# Patient Record
Sex: Male | Born: 1945 | Race: Black or African American | Hispanic: No | Smoking: Current every day smoker
Health system: Southern US, Community
[De-identification: ages and names within clinical notes are randomized; demographics above are authoritative.]

## PROBLEM LIST (undated history)

## (undated) DIAGNOSIS — D649 Anemia, unspecified: Secondary | ICD-10-CM

## (undated) DIAGNOSIS — I10 Essential (primary) hypertension: Secondary | ICD-10-CM

## (undated) DIAGNOSIS — E079 Disorder of thyroid, unspecified: Secondary | ICD-10-CM

## (undated) HISTORY — PX: OTHER SURGICAL HISTORY: SHX169

## (undated) HISTORY — DX: Disorder of thyroid, unspecified: E07.9

## (undated) HISTORY — DX: Anemia, unspecified: D64.9

---

## 2006-03-17 ENCOUNTER — Emergency Department: Payer: Self-pay

## 2008-03-07 ENCOUNTER — Emergency Department: Payer: Self-pay | Admitting: Emergency Medicine

## 2011-02-06 ENCOUNTER — Emergency Department: Payer: Self-pay | Admitting: Emergency Medicine

## 2017-11-20 ENCOUNTER — Telehealth: Payer: Self-pay | Admitting: *Deleted

## 2017-11-20 NOTE — Telephone Encounter (Signed)
Received referral for low dose lung cancer screening CT scan.  Unable to leave message at this time, will attempt call at later point.

## 2017-11-24 ENCOUNTER — Telehealth: Payer: Self-pay | Admitting: *Deleted

## 2017-11-24 NOTE — Telephone Encounter (Signed)
Received referral for low dose lung cancer screening CT scan.  Unable to leave message at this time, will attempt call at later point.

## 2017-11-25 ENCOUNTER — Telehealth: Payer: Self-pay | Admitting: *Deleted

## 2017-11-25 ENCOUNTER — Encounter: Payer: Self-pay | Admitting: *Deleted

## 2017-11-25 NOTE — Telephone Encounter (Signed)
Received referral for low dose lung cancer screening CT scan.  Unable to leave message at this time, will attempt call at later point.   Letter sent

## 2017-12-14 ENCOUNTER — Emergency Department: Payer: Medicare Other

## 2017-12-14 ENCOUNTER — Other Ambulatory Visit: Payer: Self-pay

## 2017-12-14 ENCOUNTER — Inpatient Hospital Stay
Admission: EM | Admit: 2017-12-14 | Discharge: 2017-12-16 | DRG: 435 | Disposition: A | Discharge status: Home / Self Care | Payer: Medicare Other | Attending: Specialist | Admitting: Specialist

## 2017-12-14 ENCOUNTER — Encounter: Payer: Self-pay | Admitting: Emergency Medicine

## 2017-12-14 DIAGNOSIS — I959 Hypotension, unspecified: Secondary | ICD-10-CM | POA: Diagnosis present

## 2017-12-14 DIAGNOSIS — C3490 Malignant neoplasm of unspecified part of unspecified bronchus or lung: Secondary | ICD-10-CM | POA: Diagnosis present

## 2017-12-14 DIAGNOSIS — Z79899 Other long term (current) drug therapy: Secondary | ICD-10-CM

## 2017-12-14 DIAGNOSIS — C787 Secondary malignant neoplasm of liver and intrahepatic bile duct: Secondary | ICD-10-CM | POA: Diagnosis present

## 2017-12-14 DIAGNOSIS — I10 Essential (primary) hypertension: Secondary | ICD-10-CM | POA: Diagnosis present

## 2017-12-14 DIAGNOSIS — D473 Essential (hemorrhagic) thrombocythemia: Secondary | ICD-10-CM

## 2017-12-14 DIAGNOSIS — Z716 Tobacco abuse counseling: Secondary | ICD-10-CM

## 2017-12-14 DIAGNOSIS — Z681 Body mass index (BMI) 19 or less, adult: Secondary | ICD-10-CM

## 2017-12-14 DIAGNOSIS — Z79890 Hormone replacement therapy (postmenopausal): Secondary | ICD-10-CM

## 2017-12-14 DIAGNOSIS — J449 Chronic obstructive pulmonary disease, unspecified: Secondary | ICD-10-CM | POA: Diagnosis present

## 2017-12-14 DIAGNOSIS — R000 Tachycardia, unspecified: Secondary | ICD-10-CM | POA: Diagnosis present

## 2017-12-14 DIAGNOSIS — E876 Hypokalemia: Secondary | ICD-10-CM | POA: Diagnosis present

## 2017-12-14 DIAGNOSIS — R160 Hepatomegaly, not elsewhere classified: Secondary | ICD-10-CM | POA: Diagnosis not present

## 2017-12-14 DIAGNOSIS — E43 Unspecified severe protein-calorie malnutrition: Secondary | ICD-10-CM | POA: Diagnosis present

## 2017-12-14 DIAGNOSIS — E860 Dehydration: Secondary | ICD-10-CM | POA: Diagnosis present

## 2017-12-14 DIAGNOSIS — F17210 Nicotine dependence, cigarettes, uncomplicated: Secondary | ICD-10-CM | POA: Diagnosis present

## 2017-12-14 DIAGNOSIS — K769 Liver disease, unspecified: Secondary | ICD-10-CM

## 2017-12-14 DIAGNOSIS — E039 Hypothyroidism, unspecified: Secondary | ICD-10-CM | POA: Diagnosis present

## 2017-12-14 DIAGNOSIS — R188 Other ascites: Secondary | ICD-10-CM | POA: Diagnosis present

## 2017-12-14 DIAGNOSIS — D649 Anemia, unspecified: Secondary | ICD-10-CM | POA: Diagnosis not present

## 2017-12-14 DIAGNOSIS — D509 Iron deficiency anemia, unspecified: Secondary | ICD-10-CM | POA: Diagnosis present

## 2017-12-14 DIAGNOSIS — R918 Other nonspecific abnormal finding of lung field: Secondary | ICD-10-CM | POA: Diagnosis not present

## 2017-12-14 DIAGNOSIS — A419 Sepsis, unspecified organism: Secondary | ICD-10-CM | POA: Diagnosis present

## 2017-12-14 DIAGNOSIS — D630 Anemia in neoplastic disease: Secondary | ICD-10-CM | POA: Diagnosis present

## 2017-12-14 HISTORY — DX: Essential (primary) hypertension: I10

## 2017-12-14 LAB — CBC
HCT: 29.2 % — ABNORMAL LOW (ref 40.0–52.0)
HEMOGLOBIN: 9.1 g/dL — AB (ref 13.0–18.0)
MCH: 24.2 pg — AB (ref 26.0–34.0)
MCHC: 31.2 g/dL — ABNORMAL LOW (ref 32.0–36.0)
MCV: 77.6 fL — ABNORMAL LOW (ref 80.0–100.0)
PLATELETS: 574 10*3/uL — AB (ref 150–440)
RBC: 3.77 MIL/uL — AB (ref 4.40–5.90)
RDW: 15.8 % — ABNORMAL HIGH (ref 11.5–14.5)
WBC: 18.2 10*3/uL — ABNORMAL HIGH (ref 3.8–10.6)

## 2017-12-14 LAB — BASIC METABOLIC PANEL
ANION GAP: 16 — AB (ref 5–15)
BUN: 10 mg/dL (ref 8–23)
CALCIUM: 8.9 mg/dL (ref 8.9–10.3)
CO2: 24 mmol/L (ref 22–32)
Chloride: 97 mmol/L — ABNORMAL LOW (ref 98–111)
Creatinine, Ser: 0.7 mg/dL (ref 0.61–1.24)
Glucose, Bld: 137 mg/dL — ABNORMAL HIGH (ref 70–99)
Potassium: 3 mmol/L — ABNORMAL LOW (ref 3.5–5.1)
SODIUM: 137 mmol/L (ref 135–145)

## 2017-12-14 LAB — TROPONIN I

## 2017-12-14 LAB — LACTIC ACID, PLASMA
LACTIC ACID, VENOUS: 3.7 mmol/L — AB (ref 0.5–1.9)
Lactic Acid, Venous: 2.9 mmol/L (ref 0.5–1.9)

## 2017-12-14 MED ORDER — SODIUM CHLORIDE 0.9 % IV SOLN
INTRAVENOUS | Status: DC
  Administered 2017-12-14 – 2017-12-16 (×5): via INTRAVENOUS

## 2017-12-14 MED ORDER — ACETAMINOPHEN 650 MG RE SUPP: 650.0000 mg | Freq: Four times a day (QID) | RECTAL | Status: DC | PRN

## 2017-12-14 MED ORDER — IOPAMIDOL (ISOVUE-300) INJECTION 61%
100.0000 mL | Freq: Once | INTRAVENOUS | Status: AC | PRN
  Administered 2017-12-14: 100 mL via INTRAVENOUS

## 2017-12-14 MED ORDER — ONDANSETRON HCL 4 MG PO TABS: 4.0000 mg | ORAL_TABLET | Freq: Four times a day (QID) | ORAL | Status: DC | PRN

## 2017-12-14 MED ORDER — ONDANSETRON HCL 4 MG/2ML IJ SOLN: 4.0000 mg | Freq: Four times a day (QID) | INTRAMUSCULAR | Status: DC | PRN

## 2017-12-14 MED ORDER — SODIUM CHLORIDE 0.9 % IV SOLN
2.0000 g | Freq: Three times a day (TID) | INTRAVENOUS | Status: DC
  Administered 2017-12-14 – 2017-12-16 (×5): 2 g via INTRAVENOUS
  Filled 2017-12-14 (×7): qty 2

## 2017-12-14 MED ORDER — LEVOTHYROXINE SODIUM 25 MCG PO TABS
25.0000 ug | ORAL_TABLET | Freq: Every day | ORAL | Status: DC
  Administered 2017-12-15: 06:00:00 25 ug via ORAL
  Filled 2017-12-14 (×2): qty 1

## 2017-12-14 MED ORDER — SODIUM CHLORIDE 0.9 % IV SOLN
1.0000 g | Freq: Once | INTRAVENOUS | Status: AC
  Administered 2017-12-14: 1 g via INTRAVENOUS
  Filled 2017-12-14: qty 1

## 2017-12-14 MED ORDER — SODIUM CHLORIDE 0.9 % IV BOLUS
1000.0000 mL | Freq: Once | INTRAVENOUS | Status: AC
  Administered 2017-12-14: 1000 mL via INTRAVENOUS

## 2017-12-14 MED ORDER — POTASSIUM CHLORIDE CRYS ER 20 MEQ PO TBCR
40.0000 meq | EXTENDED_RELEASE_TABLET | Freq: Once | ORAL | Status: AC
  Administered 2017-12-14: 40 meq via ORAL
  Filled 2017-12-14: qty 2

## 2017-12-14 MED ORDER — ENOXAPARIN SODIUM 40 MG/0.4ML ~~LOC~~ SOLN
40.0000 mg | SUBCUTANEOUS | Status: DC
  Filled 2017-12-14: qty 0.4

## 2017-12-14 MED ORDER — IOPAMIDOL (ISOVUE-300) INJECTION 61%
30.0000 mL | Freq: Once | INTRAVENOUS | Status: AC | PRN
  Administered 2017-12-14: 30 mL via ORAL

## 2017-12-14 MED ORDER — VANCOMYCIN HCL IN DEXTROSE 1-5 GM/200ML-% IV SOLN
1000.0000 mg | Freq: Once | INTRAVENOUS | Status: AC
  Administered 2017-12-14: 18:00:00 1000 mg via INTRAVENOUS
  Filled 2017-12-14 (×2): qty 200

## 2017-12-14 MED ORDER — ACETAMINOPHEN 325 MG PO TABS: 650.0000 mg | ORAL_TABLET | Freq: Four times a day (QID) | ORAL | Status: DC | PRN

## 2017-12-14 MED ORDER — SENNOSIDES-DOCUSATE SODIUM 8.6-50 MG PO TABS: 1.0000 | ORAL_TABLET | Freq: Every evening | ORAL | Status: DC | PRN

## 2017-12-14 MED ORDER — VANCOMYCIN HCL 10 G IV SOLR
1250.0000 mg | Freq: Three times a day (TID) | INTRAVENOUS | Status: DC
  Administered 2017-12-15 (×2): 1250 mg via INTRAVENOUS
  Filled 2017-12-14 (×4): qty 1250

## 2017-12-14 MED ORDER — HYDROCODONE-ACETAMINOPHEN 5-325 MG PO TABS: 1.0000 | ORAL_TABLET | ORAL | Status: DC | PRN

## 2017-12-14 NOTE — Progress Notes (Signed)
Pharmacy Antibiotic Note  Shane Russo is a 72 y.o. male admitted on 12/14/2017 with sepsis.  Pharmacy has been consulted for vancomycin and cefepime dosing. He was referred by an outside clinic for anemia and leukocytosis. His lactic acid is also elevated. Liver and lung masses were discovered in imaging and oncology is being consulted.  Plan: Vancomycin 1250mg  IV every 8 hours following first 1000mg  dose in the ED.  Goal trough 15-20 mcg/mL. K 0.098, T1/2: 7h, Vd 59LCalculated concentrations at steady-state: 36.2/18.3 mcg/mL Vt prior to the 4th dose  Cefepime 2 grams IV every 8 hours  Height: 6\' 4"  (193 cm) Weight: 185 lb (83.9 kg) IBW/kg (Calculated) : 86.8  Temp (24hrs), Avg:97.8 F (36.6 C), Min:97.8 F (36.6 C), Max:97.8 F (36.6 C)  Recent Labs  Lab 12/14/17 1024 12/14/17 1153  WBC 18.2*  --   CREATININE 0.70  --   LATICACIDVEN  --  3.7*    Estimated Creatinine Clearance: 99 mL/min (by C-G formula based on SCr of 0.7 mg/dL).    Antimicrobials this admission: Vancomycin  10/7 >>  cefepime 10/7 >>   Microbiology results: 10/7 BCx: pending 10/7 UCx: pending   Thank you for allowing pharmacy to be a part of this patient's care.  Dallie Piles, PharmD 12/14/2017 3:15 PM

## 2017-12-14 NOTE — ED Notes (Signed)
Floor unable to take report at this time.

## 2017-12-14 NOTE — ED Notes (Signed)
CT called and made aware that pt was done drinking contrast

## 2017-12-14 NOTE — Progress Notes (Signed)
Advanced directive education offered. Family present. Offered to continue conversation in future if desired. Chaplain available upon request.

## 2017-12-14 NOTE — H&P (Addendum)
[place] [facility] [place] NAME: Shane Russo    MR#:  [id number]  DATE OF BIRTH:  01-10-1946  DATE OF ADMISSION:  12/14/2017  PRIMARY CARE PHYSICIAN: [facility]   REQUESTING/REFERRING PHYSICIAN:   CHIEF COMPLAINT:   Chief Complaint  Patient presents with  . Weakness    HISTORY OF PRESENT ILLNESS: Shane Russo  is a 72 y.o. male with a known history of hypertension was referred by [facility] to the emergency room for low hemoglobin and elevated WBC count.  Patient has generalized weakness.  Feels tired and fatigued.  Was evaluated in the emergency room his WBC count is high and lactic acid is high.  Potassium was low and was supplemented.  Started on empiric broad-spectrum antibiotics for sepsis with unknown source.  Was also worked up with CT abdomen which showed a liver mass and multiple nodules and mass in the lung.  No history of any cancer so far.  Hospitalist service was consulted for further care.  PAST MEDICAL HISTORY:   Past Medical History:  Diagnosis Date  . Hypertension     PAST SURGICAL HISTORY: None  SOCIAL HISTORY:  Active smoker Drinks alcohol on a regular basis No history of illicit drug use  FAMILY HISTORY: Mother and father deceased No history of COPD, diabetes and cancer in the family  DRUG ALLERGIES: No known drug allergies  REVIEW OF SYSTEMS:   CONSTITUTIONAL: No fever, has fatigue and weakness.  EYES: No blurred or double vision.  EARS, NOSE, AND THROAT: No tinnitus or ear pain.  RESPIRATORY: No cough, shortness of breath, wheezing or hemoptysis.  CARDIOVASCULAR: No chest pain, orthopnea, edema.  GASTROINTESTINAL: No nausea, vomiting, diarrhea or abdominal pain.  GENITOURINARY: No dysuria, hematuria.  ENDOCRINE: No polyuria, nocturia,  HEMATOLOGY: No anemia, easy bruising or bleeding SKIN: No rash or lesion. MUSCULOSKELETAL: No joint pain or arthritis.   NEUROLOGIC: No  tingling, numbness, weakness.  PSYCHIATRY: No anxiety or depression.   MEDICATIONS AT HOME:  Prior to Admission medications   Medication Sig Start Date End Date Taking? Authorizing Provider  levothyroxine (SYNTHROID, LEVOTHROID) 25 MCG tablet Take 25 mcg by mouth daily. 11/20/17  Yes [provider]      PHYSICAL EXAMINATION:   VITAL SIGNS: Blood pressure 121/80, pulse 98, temperature 97.8 F (36.6 C), temperature source Oral, resp. rate (!) 37, height 6\' 4"  (1.93 m), weight 83.9 kg, SpO2 97 %.  GENERAL:  72 y.o.-year-old patient lying in the bed with no acute distress.  EYES: Pupils equal, round, reactive to light and accommodation. No scleral icterus. Extraocular muscles intact.  HEENT: Head atraumatic, normocephalic. Oropharynx dry and nasopharynx clear.  NECK:  Supple, no jugular venous distention. No thyroid enlargement, no tenderness.  LUNGS: Normal breath sounds bilaterally, no wheezing, rales,rhonchi or crepitation. No use of accessory muscles of respiration.  CARDIOVASCULAR: S1, S2 normal. No murmurs, rubs, or gallops.  ABDOMEN: Soft, nontender, nondistended. Bowel sounds present. Has hepatomegaly. EXTREMITIES: No pedal edema, cyanosis, or clubbing.  NEUROLOGIC: Cranial nerves II through XII are intact. Muscle strength 5/5 in all extremities. Sensation intact. Gait not checked.  PSYCHIATRIC: The patient is alert and oriented x 3.  SKIN: No obvious rash, lesion, or ulcer.   LABORATORY PANEL:   CBC Recent Labs  Lab 12/14/17 1024  WBC 18.2*  HGB 9.1*  HCT 29.2*  PLT 574*  MCV 77.6*  MCH 24.2*  MCHC 31.2*  RDW 15.8*   ------------------------------------------------------------------------------------------------------------------  Chemistries  Recent Labs  Lab 12/14/17 1024  NA 137  K 3.0*  CL 97*  CO2 24  GLUCOSE 137*  BUN 10  CREATININE 0.70  CALCIUM 8.9    ------------------------------------------------------------------------------------------------------------------ estimated creatinine clearance is 99 mL/min (by C-G formula based on SCr of 0.7 mg/dL). ------------------------------------------------------------------------------------------------------------------ No results for input(s): TSH, T4TOTAL, T3FREE, THYROIDAB in the last 72 hours.  Invalid input(s): FREET3   Coagulation profile No results for input(s): INR, PROTIME in the last 168 hours. ------------------------------------------------------------------------------------------------------------------- No results for input(s): DDIMER in the last 72 hours. -------------------------------------------------------------------------------------------------------------------  Cardiac Enzymes Recent Labs  Lab 12/14/17 1024  TROPONINI <0.03   ------------------------------------------------------------------------------------------------------------------ Invalid input(s): POCBNP  ---------------------------------------------------------------------------------------------------------------  Urinalysis No results found for: COLORURINE, APPEARANCEUR, LABSPEC, PHURINE, GLUCOSEU, HGBUR, BILIRUBINUR, KETONESUR, PROTEINUR, UROBILINOGEN, NITRITE, LEUKOCYTESUR   RADIOLOGY: Dg Chest 2 View  Result Date: 12/14/2017 CLINICAL DATA:  Weakness. Leukocytosis. Anemia. EXAM: CHEST - 2 VIEW COMPARISON:  None. FINDINGS: Normal sized heart. Tortuous aorta. 4.5 cm mass in the superior segment of the left lower lobe. Multiple additional smaller bilateral lung nodules. Unremarkable bones. IMPRESSION: 4.5 cm mass in the superior segment of the left lower lobe with multiple additional smaller bilateral lung nodules. This is concerning for metastases with a possible primary lung carcinoma. An infectious process is less likely. Further evaluation with a chest CT with contrast is recommended.  Electronically Signed   By: Claudie Revering M.D.   On: 12/14/2017 13:10   Ct Abdomen Pelvis W Contrast  Result Date: 12/14/2017 CLINICAL DATA:  Low hemoglobin, elevated white blood cell count, weakness and severe weight loss EXAM: CT ABDOMEN AND PELVIS WITH CONTRAST TECHNIQUE: Multidetector CT imaging of the abdomen and pelvis was performed using the standard protocol following bolus administration of intravenous contrast. CONTRAST:  151mL ISOVUE-300 IOPAMIDOL (ISOVUE-300) INJECTION 61% COMPARISON:  None. FINDINGS: Lower chest: Multiple lung nodules are present at the lung bases left-greater-than-right most consistent with metastatic involvement of the lungs. No pleural effusion is seen. The heart is within normal limits in size. No pericardial effusion is evident. Hepatobiliary: There are multiple lesions throughout both the right and left lobes of liver of varying sizes, consistent with diffuse metastatic involvement of the liver. One of the largest lesions involves the medial segment of the left lobe measuring approximately 9.4 x 9.8 cm. No intrahepatic ductal dilatation is seen. Gallbladder is not visualized. Pancreas: The pancreas appears atrophic. The pancreatic duct is not dilated. Spleen: The spleen is unremarkable. Adrenals/Urinary Tract: The adrenal glands appear normal. The kidneys enhance with no focal abnormality and no calculi are evident. On delayed images, the pelvocaliceal systems appear normal. No hydronephrosis is seen. The ureters appear normal in caliber. The urinary bladder is decompressed and cannot be evaluated. Stomach/Bowel: The stomach is very distended with oral contrast and air. No definite abnormality of the small bowel is seen. There is a moderate amount of feces particularly in the rectosigmoid region. No edema of the rectal mucosa is evident. The hepatic flexure of colon is deviated medially by the large hepatic lesion. The terminal ileum and the appendix appear unremarkable.  Vascular/Lymphatic: The abdominal aorta is normal in caliber with moderate abdominal aortic atherosclerosis. No adenopathy is seen. Reproductive: The prostate is unremarkable. Other: No abdominal wall hernia is seen. There is a small amount of ascites present around the liver dependently layering within the pelvis. Musculoskeletal: There is degenerative disc disease at L3-4 with considerable loss of disc space and slight anterolisthesis of L3 on L4. No lytic or blastic lesion  is evident. IMPRESSION: 1. Multiple liver lesions throughout both the right and left lobes consistent with liver metastasis. Multicentric hepatoma cannot be excluded. 2. Multiple lung nodules at the lung bases left-greater-than-right are consistent with metastatic involvement of the lungs. Chest x-ray or CT of the chest would be helpful to assess for possible primary lesion. 3. Small amount of ascites. Electronically Signed   By: Ivar Drape M.D.   On: 12/14/2017 13:47    EKG: Orders placed or performed during the hospital encounter of 12/14/17  . ED EKG  . ED EKG  . EKG 12-Lead  . EKG 12-Lead    IMPRESSION AND PLAN:  72 year old elderly male patient with history of high blood pressure presented to the emergency room with generalized weakness and was referred from [place] [facility] at.  -Sepsis Admit patient to medical floor IV fluids and broad-spectrum antibiotics that is vancomycin and cefepime intravenously Follow-up culture and sensitivity The follow-up lactic acid level  -Liver mass and lung mass Work-up for metastatic cancer Oncology consultation  -Thrombocytosis Could be reactive Follow platelet count  -Hypokalemia Oral potassium supplementation  -Dehydration IV fluids  -DVT prophylaxis subcu Lovenox daily All the records are reviewed and case discussed with ED provider. Management plans discussed with the patient, family and they are in agreement.  -Tobacco abuse Tobacco cessation counseled to the  patient for 6 minutes Nicotine patch offered  CODE STATUS:Full code    TOTAL TIME TAKING CARE OF THIS PATIENT: 52 minutes.    Saundra Shelling M.D on 12/14/2017 at 3:28 PM  Between 7am to 6pm - Pager - [phone]  After 6pm go to [email or web address] - password [email or web address] [facility]  [place]  [facility]  Office  [phone]  CC: Primary care physician; [facility], [facility]

## 2017-12-14 NOTE — ED Triage Notes (Signed)
Pt daughter in law reports that [facility] called and advised to pt to come to the ED due to low hbg and an elevated WBC. Pt family reports pt has been sleeping more over the last couple of moths.

## 2017-12-14 NOTE — Progress Notes (Signed)
CODE SEPSIS - PHARMACY COMMUNICATION  **Broad Spectrum Antibiotics should be administered within 1 hour of Sepsis diagnosis**  Time Code Sepsis Called/Page Received: 1444  Antibiotics Ordered: vancomycin and cefepime  Time of 1st antibiotic administration: 1451  Additional action taken by pharmacy: none required  If necessary, Name of Provider/Nurse Contacted: N/A    Dallie Piles ,PharmD Clinical Pharmacist  12/14/2017  3:10 PM

## 2017-12-14 NOTE — Progress Notes (Signed)
Advanced care plan.  Purpose of the Encounter: CODE STATUS  Parties in Attendance: Patient and patient's family  Patient's Decision Capacity: Good  Subjective/Patient's story: Presented to emergency room for weakness and abnormal blood work   Objective/Medical story Patient has lung and liver mass during the work-up in the emergency room on CT scan.  He also has elevated WBC count and lactic acid and needs IV antibiotics and fluids   Goals of care determination:  Advance care directives goals of care discussed For now patient and family want everything done which includes CPR, intubation ventilator if the need arises   CODE STATUS: Full code   Time spent discussing advanced care planning: 16 minutes

## 2017-12-14 NOTE — ED Provider Notes (Signed)
[facility] Emergency Department Provider Note ____________________________________________   I have reviewed the triage vital signs and the triage nursing note.  HISTORY  Chief Complaint Weakness   Historian Level 5 Caveat History Limited by patient poor historian Son with whom he has recently come to live provides most of the history  HPI Shane Russo is a 72 y.o. male, uncertain past medical history as patient is really unable to provide it, and son has recently started caring for him in the home.  Because of noting that the patient had been losing weight over the past several months and recently come to stay with the son and the son brought him to Dr. Nicki Reaper for routine appointment this past week where he had blood work.  They were called and told that the white blood cell count was elevated and that the patient was anemic and to go to the ED for further evaluation.  Patient himself denies a headache, chest pain, trouble breathing, coughing, abdominal pain.  Son states that he has noticed his father having watery diarrhea for about 3 days.  No reported fever or chills.  It sounds like he has some baseline dementia or confusion.     Past Medical History:  Diagnosis Date  . Hypertension     Patient Active Problem List   Diagnosis Date Noted  . Sepsis ([place]) 12/14/2017    Past Surgical History:  Procedure Laterality Date  . none      Prior to Admission medications   Medication Sig Start Date End Date Taking? Authorizing Provider  levothyroxine (SYNTHROID, LEVOTHROID) 25 MCG tablet Take 25 mcg by mouth daily. 11/20/17  Yes [provider]  multivitamin (ONE-A-DAY MEN'S) TABS tablet Take 1 tablet by mouth daily.   Yes [provider]    No Known Allergies  No family history on file.  Social History Social History   Tobacco Use  . Smoking status: Current Every Day Smoker  . Smokeless tobacco: Never Used  Substance Use  Topics  . Alcohol use: Yes    Frequency: Never  . Drug use: Never    Review of Systems  Constitutional: Negative for fever. Eyes: Negative for visual changes. ENT: Negative for sore throat. Cardiovascular: Negative for chest pain. Respiratory: Negative for shortness of breath. Gastrointestinal: Negative for abdominal pain, vomiting. Genitourinary: Negative for dysuria. Musculoskeletal: Negative for back pain. Skin: Negative for rash. Neurological: Negative for headache.  ____________________________________________   PHYSICAL EXAM:  VITAL SIGNS: ED Triage Vitals [12/14/17 1016]  Enc Vitals Group     BP 97/65     Pulse Rate (!) 119     Resp 20     Temp 97.8 F (36.6 C)     Temp Source Oral     SpO2 100 %     Weight 185 lb (83.9 kg)     Height 6\' 4"  (1.93 m)     Head Circumference      Peak Flow      Pain Score 0     Pain Loc      Pain Edu?      Excl. in [place]?      Constitutional: Alert and oriented.  HEENT      Head: Normocephalic and atraumatic.      Eyes: Conjunctivae are normal. Pupils equal and round.       Ears:         Nose: No congestion/rhinnorhea.      Mouth/Throat: Mucous membranes are mildly dry.  Neck: No stridor. Cardiovascular/Chest: Tachycardic rate, regular rhythm.  No murmurs, rubs, or gallops. Respiratory: Normal respiratory effort without tachypnea nor retractions. Breath sounds are clear and equal bilaterally. No wheezes/rales/rhonchi. Gastrointestinal: Soft. No distention, no guarding, no rebound. Nontender.    Genitourinary/rectal: No external hemorrhoids.  Nontender rectal exam.  Brown-colored stool, Hemoccult negative Musculoskeletal: Nontender with normal range of motion in all extremities. No joint effusions.  No lower extremity tenderness.  No edema. Neurologic: Poor historian.  Cooperative.  No facial droop.  Normal speech and language. No gross or focal neurologic deficits are appreciated. Skin:  Skin is warm, dry and intact. No  rash noted. Psychiatric: Cooperative.   ____________________________________________  LABS (pertinent positives/negatives) I, Lisa Roca, MD the attending physician have reviewed the labs noted below.  Labs Reviewed  BASIC METABOLIC PANEL - Abnormal; Notable for the following components:      Result Value   Potassium 3.0 (*)    Chloride 97 (*)    Glucose, Bld 137 (*)    Anion gap 16 (*)    All other components within normal limits  CBC - Abnormal; Notable for the following components:   WBC 18.2 (*)    RBC 3.77 (*)    Hemoglobin 9.1 (*)    HCT 29.2 (*)    MCV 77.6 (*)    MCH 24.2 (*)    MCHC 31.2 (*)    RDW 15.8 (*)    Platelets 574 (*)    All other components within normal limits  LACTIC ACID, PLASMA - Abnormal; Notable for the following components:   Lactic Acid, Venous 3.7 (*)    All other components within normal limits  CULTURE, BLOOD (ROUTINE X 2)  CULTURE, BLOOD (ROUTINE X 2)  TROPONIN I  URINALYSIS, COMPLETE (UACMP) WITH MICROSCOPIC  LACTIC ACID, PLASMA  CBG MONITORING, ED  TYPE AND SCREEN    ____________________________________________    EKG I, Lisa Roca, MD, the attending physician have personally viewed and interpreted all ECGs.  103 bpm.  Sinus tachycardia.  Narrow QRS.  Normal axis.  Occasional PVCs ____________________________________________  RADIOLOGY   Chest x-ray, viewed by me, radiologist interpretation reviewed: IMPRESSION: 4.5 cm mass in the superior segment of the left lower lobe with multiple additional smaller bilateral lung nodules. This is concerning for metastases with a possible primary lung carcinoma. An infectious process is less likely. Further evaluation with a chest CT with contrast is recommended.  CT abdomen pelvis with contrast, radiologist interpretation reviewed:  IMPRESSION: 1. Multiple liver lesions throughout both the right and left lobes consistent with liver metastasis. Multicentric hepatoma cannot  be excluded. 2. Multiple lung nodules at the lung bases left-greater-than-right are consistent with metastatic involvement of the lungs. Chest x-ray or CT of the chest would be helpful to assess for possible primary lesion. 3. Small amount of ascites. __________________________________________  PROCEDURES  Procedure(s) performed: None  Procedures  Critical Care performed: CRITICAL CARE Performed by: Lisa Roca   Total critical care time: 30 minutes  Critical care time was exclusive of separately billable procedures and treating other patients.  Critical care was necessary to treat or prevent imminent or life-threatening deterioration.  Critical care was time spent personally by me on the following activities: development of treatment plan with patient and/or surrogate as well as nursing, discussions with consultants, evaluation of patient's response to treatment, examination of patient, obtaining history from patient or surrogate, ordering and performing treatments and interventions, ordering and review of laboratory studies, ordering and review of radiographic  studies, pulse oximetry and re-evaluation of patient's condition.    ____________________________________________  ED COURSE / ASSESSMENT AND PLAN  Pertinent labs & imaging results that were available during my care of the patient were reviewed by me and considered in my medical decision making (see chart for details).    Patient really does not provide much of his own history, sounds like he has not seen doctors in quite some time.  In any case, he went for routine appointment this past week and had blood work which was concerning for elevated white blood cell count and anemia and he was sent here.  Today he does have elevated white blood count 18,000 and hemoglobin of 9.1 without prior for comparison.  He is hypokalemic.  He was given potassium repletion here.  Sounds like there is a diarrhea history and worsening  weakness over the past couple of days although weakness and confusion and significant weight loss has occurred over the past several months.  He is hypotensive here with some tachycardia, initially suspicious for dehydration related, however with the elevated white blood cell count, and lactate elevated, we will go ahead and start antibiotics to cover for sepsis of unknown source until blood cultures resolved.   Discussed cxr and ct results concerning for metastatic cancer with patient and son.      CONSULTATIONS:   Hospitalist for admission.   Patient / Family / Caregiver informed of clinical course, medical decision-making process, and agree with plan.    ___________________________________________   FINAL CLINICAL IMPRESSION(S) / ED DIAGNOSES   Final diagnoses:  Hypokalemia  Anemia, unspecified type  Lung mass  Liver mass      ___________________________________________         Note: This dictation was prepared with Dragon dictation. Any transcriptional errors that result from this process are unintentional    Lisa Roca, MD 12/14/17 1623

## 2017-12-14 NOTE — Consult Note (Signed)
[facility]  Date of admission:  12/14/2017  Inpatient day:  12/14/2017  Consulting physician:  Dr Reatha Harps Pyreddy  Reason for Consultation:  Metastatic cancer.  Chief Complaint: Shane Russo is a 72 y.o. male who was admitted through the emergency room with general weakness, lung and liver masses.  HPI:  The patient notes a 20-30 pack year smoking history.  He has lost approximately 22 pounds over the past year.  He states that his appetite is poor.  He denies any nausea, vomiting or diarrhea.  Over the past 2 weeks, he has been followed at the [facility] at his son's insistence.  He presented with weight loss.  Initial labs revealed a "high white blood cell count and anemia" worrisome for infection.  He had a cough.  He denied any fever.  He was treated with antibiotics.  He presented to [facility] ER.  CBC revealed a hematocrit of 29.2, hemoglobin 9.1, MCV 77.6, platelets 574,000, and WBC 18,200.  Creatinine was 0.70.  Lactic acid was 3.7 (elevated).  CXR revealed a 4.5 cm mass in the superior segment of the left lower lobe and multiple smaller bilateral lung nodules.  Abdomen and pelvic CT revealed multiple liver lesions throughout both the right and left lobes consistent with liver metastasis. Largest lesion was 9.4 x 9.8 cm.  Multicentric hepatoma cannot be excluded.  There were multiple lung nodules at the lung bases left-greater-than-right c/w metastatic involvement of the lungs.   There was a small amount of ascites.  He was empirically started on vancomycin and Cefepime.  Blood cultures are negative to date.   Past Medical History:  Diagnosis Date  . Hypertension     Past Surgical History:  Procedure Laterality Date  . none      History reviewed. No pertinent family history.  No family history of malignancy.  Social History:  reports that he has been smoking. He has never used smokeless tobacco. He reports that he drinks alcohol. He reports that  he does not use drugs. He currently smokes 1/3 pack/day.  He started smoking as a child.  He previously drank a 5th every day.  He currently drinks a pint a week.  He denies any exposure to radiation or toxins.  He previously was a Museum/gallery curator.  He lives in a house with 3 other people (1 family member and 2 friends).  He is accompanied by his son and daughter-in-law, [name].  Allergies: No Known Allergies  Medications Prior to Admission  Medication Sig Dispense Refill  . levothyroxine (SYNTHROID, LEVOTHROID) 25 MCG tablet Take 25 mcg by mouth daily.  1  . multivitamin (ONE-A-DAY MEN'S) TABS tablet Take 1 tablet by mouth daily.      Review of Systems: GENERAL:  Feels "ok".  No fevers, sweats or weight loss. PERFORMANCE STATUS (ECOG):  1-2 HEENT:  Runny nose.  No visual changes, sore throat, mouth sores or tenderness. Lungs: No shortness of breath.  Cough, resolved.  No hemoptysis. Cardiac:  No chest pain, palpitations, orthopnea, or PND. GI:  No nausea, vomiting, diarrhea, constipation, melena or hematochezia. GU:  No urgency, frequency, dysuria, or hematuria. Musculoskeletal:  No back pain.  No joint pain.  No muscle tenderness. Extremities:  No pain or swelling. Skin:  No rashes or skin changes. Neuro:  No headache, numbness or weakness, balance or coordination issues. Endocrine:  No diabetes, hot flashes or night sweats. Psych:  No mood changes, depression or anxiety.  Sleeps a lot. Pain:  No focal pain. Review of systems:  All other systems reviewed and found to be negative.  Physical Exam:  Blood pressure 100/64, pulse (!) 102, temperature 98.9 F (37.2 C), temperature source Oral, resp. rate 18, height _0  (1.93 m), weight 146 lb 6.2 oz (66.4 kg), SpO2 96 %.  GENERAL:  Chronically ill appearing gentleman sitting comfortably on the medical unit in no acute distress. MENTAL STATUS:  Alert and oriented to person, place and time. HEAD: Near alopecia.  Temporal wasting.   Normocephalic, atraumatic, face symmetric, no Cushingoid features. EYES:  Brown eyes.  Ruddy.  Pupils equal round and reactive to light and accomodation.  No conjunctivitis or scleral icterus. ENT:  Oropharynx clear without lesion.  Tongue normal.  Poor dentition.  Mucous membranes moist.  RESPIRATORY:  Clear to auscultation without rales, wheezes or rhonchi. CARDIOVASCULAR:  Regular rate and rhythm without murmur, rub or gallop. ABDOMEN:  Soft, non-tender, with active bowel sounds, and no splenomegaly.  Liver palpable.  No masses. SKIN:  No rashes, ulcers or lesions. EXTREMITIES: Clubbing.  No edema, no skin discoloration or tenderness.  No palpable cords. LYMPH NODES: No palpable cervical, supraclavicular, axillary or inguinal adenopathy  NEUROLOGICAL: Unremarkable. PSYCH:  Appropriate.   Results for orders placed or performed during the hospital encounter of 12/14/17 (from the past 48 hour(s))  Basic metabolic panel     Status: Abnormal   Collection Time: 12/14/17 10:24 AM  Result Value Ref Range   Sodium 137 135 - 145 mmol/L   Potassium 3.0 (L) 3.5 - 5.1 mmol/L   Chloride 97 (L) 98 - 111 mmol/L   CO2 24 22 - 32 mmol/L   Glucose, Bld 137 (H) 70 - 99 mg/dL   BUN 10 8 - 23 mg/dL   Creatinine, Ser 0.70 0.61 - 1.24 mg/dL   Calcium 8.9 8.9 - 10.3 mg/dL   GFR calc non Af Amer >60 >60 mL/min   GFR calc Af Amer >60 >60 mL/min    Comment: (NOTE) The eGFR has been calculated using the CKD EPI equation. This calculation has not been validated in all clinical situations. eGFR's persistently <60 mL/min signify possible Chronic Kidney Disease.    Anion gap 16 (H) 5 - 15    Comment: Performed at [facility], [address] [place], [place] [address]  CBC     Status: Abnormal   Collection Time: 12/14/17 10:24 AM  Result Value Ref Range   WBC 18.2 (H) 3.8 - 10.6 K/uL   RBC 3.77 (L) 4.40 - 5.90 MIL/uL   Hemoglobin 9.1 (L) 13.0 - 18.0 g/dL   HCT 29.2 (L) 40.0 - 52.0 %   MCV 77.6  (L) 80.0 - 100.0 fL   MCH 24.2 (L) 26.0 - 34.0 pg   MCHC 31.2 (L) 32.0 - 36.0 g/dL   RDW 15.8 (H) 11.5 - 14.5 %   Platelets 574 (H) 150 - 440 K/uL    Comment: Performed at [facility], [address] [place], [place] [address]  Type and screen [place]     Status: None   Collection Time: 12/14/17 10:24 AM  Result Value Ref Range   ABO/RH(D) A POS    Antibody Screen NEG    Sample Expiration      12/17/2017 Performed at [facility], [address] [place],  [address]   Troponin I     Status: None   Collection Time: 12/14/17 10:24 AM  Result Value Ref Range  Troponin I <0.03 <0.03 ng/mL    Comment: Performed at [facility], [place], [place] [address]  Lactic acid, plasma     Status: Abnormal   Collection Time: 12/14/17 11:53 AM  Result Value Ref Range   Lactic Acid, Venous 3.7 (HH) 0.5 - 1.9 mmol/L    Comment: CRITICAL RESULT CALLED TO, READ BACK BY AND VERIFIED WITH KAILEY WALKER AT 4696 12/14/17 DAS Performed at [facility], [address] [place], [place] [address]   Lactic acid, plasma     Status: Abnormal   Collection Time: 12/14/17  5:51 PM  Result Value Ref Range   Lactic Acid, Venous 2.9 (HH) 0.5 - 1.9 mmol/L    Comment: CRITICAL RESULT CALLED TO, READ BACK BY AND VERIFIED WITH BRANDY MILLER AT 1840 12/14/2017.  TFK Performed at [facility], [address] [place], [place] [address]    Dg Chest 2 View  Result Date: 12/14/2017 CLINICAL DATA:  Weakness. Leukocytosis. Anemia. EXAM: CHEST - 2 VIEW COMPARISON:  None. FINDINGS: Normal sized heart. Tortuous aorta. 4.5 cm mass in the superior segment of the left lower lobe. Multiple additional smaller bilateral lung nodules. Unremarkable bones. IMPRESSION: 4.5 cm mass in the superior segment of the left lower lobe with multiple additional smaller bilateral lung nodules. This is concerning for metastases with a possible primary  lung carcinoma. An infectious process is less likely. Further evaluation with a chest CT with contrast is recommended. Electronically Signed   By: Claudie Revering M.D.   On: 12/14/2017 13:10   Ct Abdomen Pelvis W Contrast  Result Date: 12/14/2017 CLINICAL DATA:  Low hemoglobin, elevated white blood cell count, weakness and severe weight loss EXAM: CT ABDOMEN AND PELVIS WITH CONTRAST TECHNIQUE: Multidetector CT imaging of the abdomen and pelvis was performed using the standard protocol following bolus administration of intravenous contrast. CONTRAST:  143m ISOVUE-300 IOPAMIDOL (ISOVUE-300) INJECTION 61% COMPARISON:  None. FINDINGS: Lower chest: Multiple lung nodules are present at the lung bases left-greater-than-right most consistent with metastatic involvement of the lungs. No pleural effusion is seen. The heart is within normal limits in size. No pericardial effusion is evident. Hepatobiliary: There are multiple lesions throughout both the right and left lobes of liver of varying sizes, consistent with diffuse metastatic involvement of the liver. One of the largest lesions involves the medial segment of the left lobe measuring approximately 9.4 x 9.8 cm. No intrahepatic ductal dilatation is seen. Gallbladder is not visualized. Pancreas: The pancreas appears atrophic. The pancreatic duct is not dilated. Spleen: The spleen is unremarkable. Adrenals/Urinary Tract: The adrenal glands appear normal. The kidneys enhance with no focal abnormality and no calculi are evident. On delayed images, the pelvocaliceal systems appear normal. No hydronephrosis is seen. The ureters appear normal in caliber. The urinary bladder is decompressed and cannot be evaluated. Stomach/Bowel: The stomach is very distended with oral contrast and air. No definite abnormality of the small bowel is seen. There is a moderate amount of feces particularly in the rectosigmoid region. No edema of the rectal mucosa is evident. The hepatic flexure of  colon is deviated medially by the large hepatic lesion. The terminal ileum and the appendix appear unremarkable. Vascular/Lymphatic: The abdominal aorta is normal in caliber with moderate abdominal aortic atherosclerosis. No adenopathy is seen. Reproductive: The prostate is unremarkable. Other: No abdominal wall hernia is seen. There is a small amount of ascites present around the liver dependently layering within the pelvis. Musculoskeletal: There is degenerative disc disease  at L3-4 with considerable loss of disc space and slight anterolisthesis of L3 on L4. No lytic or blastic lesion is evident. IMPRESSION: 1. Multiple liver lesions throughout both the right and left lobes consistent with liver metastasis. Multicentric hepatoma cannot be excluded. 2. Multiple lung nodules at the lung bases left-greater-than-right are consistent with metastatic involvement of the lungs. Chest x-ray or CT of the chest would be helpful to assess for possible primary lesion. 3. Small amount of ascites. Electronically Signed   By: Ivar Drape M.D.   On: 12/14/2017 13:47    Assessment:  The patient is a 72 y.o. gentleman with lung and liver lesions worrisome for primary lung cancer.   CXR on 12/14/2017 revealed a 4.5 cm mass in the superior segment of the left lower lobe and multiple smaller bilateral lung nodules.  Abdomen and pelvic CT on 03/16/2017 revealed multiple liver lesions. Largest lesion was 9.4 x 9.8 cm.  There was a small amount of ascites.  Symptomatically, he has lost 22 pounds over the past year secondary to a poor appetite.  Exam reveals hepatomegaly and clubbing.  Plan:   1.  Oncology:  Imaging reviewed personally with patient and his family.  Imaging worrisome for metastatic lung cancer.  Discuss plan for formal chest CT.  Discuss plan for biopsy.  Anticipate liver biopsy unless chest CT reveals another easily accessible site.  Labs:  CMP, CEA, AFP, PT/INR. 2.  Hematology:  Microcytic anemia c/w  iron deficiency.  No prior CBCs.  Thrombocytosis likely secondary to iron deficiency.  Check ferritin and iron studies.  Check B12 and folate secondary to poor diet and alcohol history.  Thank you for allowing me to participate in Trevyn Lumpkin 's care.  I will follow him closely with you while hospitalized and after discharge in the outpatient department.   Lequita Asal, MD  12/14/2017, 8:57 PM

## 2017-12-15 ENCOUNTER — Inpatient Hospital Stay: Payer: Medicare Other

## 2017-12-15 DIAGNOSIS — R160 Hepatomegaly, not elsewhere classified: Secondary | ICD-10-CM

## 2017-12-15 DIAGNOSIS — R918 Other nonspecific abnormal finding of lung field: Secondary | ICD-10-CM

## 2017-12-15 DIAGNOSIS — D649 Anemia, unspecified: Secondary | ICD-10-CM

## 2017-12-15 LAB — COMPREHENSIVE METABOLIC PANEL
ALT: 14 U/L (ref 0–44)
AST: 46 U/L — ABNORMAL HIGH (ref 15–41)
Albumin: 2.4 g/dL — ABNORMAL LOW (ref 3.5–5.0)
Alkaline Phosphatase: 141 U/L — ABNORMAL HIGH (ref 38–126)
Anion gap: 7 (ref 5–15)
BUN: 9 mg/dL (ref 8–23)
CO2: 25 mmol/L (ref 22–32)
Calcium: 7.8 mg/dL — ABNORMAL LOW (ref 8.9–10.3)
Chloride: 102 mmol/L (ref 98–111)
Creatinine, Ser: 0.61 mg/dL (ref 0.61–1.24)
GFR calc Af Amer: >60 mL/min (ref >60)
GFR calc non Af Amer: >60 mL/min (ref >60)
Glucose, Bld: 97 mg/dL (ref 70–99)
Potassium: 3.1 mmol/L — ABNORMAL LOW (ref 3.5–5.1)
Sodium: 134 mmol/L — ABNORMAL LOW (ref 135–145)
Total Bilirubin: 1.3 mg/dL — ABNORMAL HIGH (ref 0.3–1.2)
Total Protein: 6.1 g/dL — ABNORMAL LOW (ref 6.5–8.1)

## 2017-12-15 LAB — PROTIME-INR
INR: 1.16
Prothrombin Time: 14.7 seconds (ref 11.4–15.2)

## 2017-12-15 LAB — CBC WITH DIFFERENTIAL/PLATELET
Basophils Absolute: 0.1 10*3/uL (ref 0–0.1)
Basophils Relative: 1 %
Eosinophils Absolute: 0.1 10*3/uL (ref 0–0.7)
Eosinophils Relative: 1 %
HCT: 21.4 % — ABNORMAL LOW (ref 40.0–52.0)
Hemoglobin: 6.9 g/dL — ABNORMAL LOW (ref 13.0–18.0)
Lymphocytes Relative: 5 %
Lymphs Abs: 0.8 10*3/uL — ABNORMAL LOW (ref 1.0–3.6)
MCH: 25 pg — ABNORMAL LOW (ref 26.0–34.0)
MCHC: 32.4 g/dL (ref 32.0–36.0)
MCV: 77.3 fL — ABNORMAL LOW (ref 80.0–100.0)
Monocytes Absolute: 1.7 10*3/uL — ABNORMAL HIGH (ref 0.2–1.0)
Monocytes Relative: 11 %
Neutro Abs: 12.6 10*3/uL — ABNORMAL HIGH (ref 1.4–6.5)
Neutrophils Relative %: 82 %
Platelets: 446 10*3/uL — ABNORMAL HIGH (ref 150–440)
RBC: 2.77 MIL/uL — ABNORMAL LOW (ref 4.40–5.90)
RDW: 15.7 % — ABNORMAL HIGH (ref 11.5–14.5)
WBC: 15.3 10*3/uL — ABNORMAL HIGH (ref 3.8–10.6)

## 2017-12-15 LAB — FERRITIN: Ferritin: 1326 ng/mL — ABNORMAL HIGH (ref 24–336)

## 2017-12-15 LAB — MAGNESIUM: Magnesium: 1.5 mg/dL — ABNORMAL LOW (ref 1.7–2.4)

## 2017-12-15 LAB — URINALYSIS, COMPLETE (UACMP) WITH MICROSCOPIC
BILIRUBIN URINE: NEGATIVE
Bacteria, UA: NONE SEEN
GLUCOSE, UA: NEGATIVE mg/dL
Hgb urine dipstick: NEGATIVE
KETONES UR: NEGATIVE mg/dL
LEUKOCYTES UA: NEGATIVE
Nitrite: NEGATIVE
PH: 5 (ref 5.0–8.0)
Protein, ur: NEGATIVE mg/dL
Specific Gravity, Urine: >1.046 — ABNORMAL HIGH (ref 1.005–1.030)

## 2017-12-15 LAB — MRSA PCR SCREENING: MRSA by PCR: NEGATIVE

## 2017-12-15 LAB — FOLATE: Folate: 6.4 ng/mL (ref >5.9)

## 2017-12-15 LAB — RETICULOCYTES
Immature Retic Fract: 32.5 % — ABNORMAL HIGH (ref 2.3–15.9)
RBC.: 2.67 MIL/uL — ABNORMAL LOW (ref 4.22–5.81)
Retic Count, Absolute: 68.6 10*3/uL (ref 19.0–186.0)
Retic Ct Pct: 2.6 % (ref 0.4–3.1)

## 2017-12-15 LAB — IRON AND TIBC
Iron: 23 ug/dL — ABNORMAL LOW (ref 45–182)
Saturation Ratios: 17 % — ABNORMAL LOW (ref 17.9–39.5)
TIBC: 134 ug/dL — ABNORMAL LOW (ref 250–450)
UIBC: 111 ug/dL

## 2017-12-15 LAB — PREPARE RBC (CROSSMATCH)

## 2017-12-15 LAB — VITAMIN B12: Vitamin B-12: 553 pg/mL (ref 180–914)

## 2017-12-15 LAB — ABO/RH: ABO/RH(D): A POS

## 2017-12-15 MED ORDER — MAGNESIUM SULFATE 2 GM/50ML IV SOLN
2.0000 g | Freq: Once | INTRAVENOUS | Status: AC
  Administered 2017-12-15: 2 g via INTRAVENOUS
  Filled 2017-12-15: qty 50

## 2017-12-15 MED ORDER — ENSURE ENLIVE PO LIQD
237.0000 mL | Freq: Three times a day (TID) | ORAL | Status: DC
  Administered 2017-12-15 – 2017-12-16 (×4): 237 mL via ORAL

## 2017-12-15 MED ORDER — VITAMIN B-1 100 MG PO TABS
100.0000 mg | ORAL_TABLET | Freq: Every day | ORAL | Status: DC
  Administered 2017-12-15 – 2017-12-16 (×2): 100 mg via ORAL
  Filled 2017-12-15 (×3): qty 1

## 2017-12-15 MED ORDER — ADULT MULTIVITAMIN W/MINERALS CH: 1.0000 | ORAL_TABLET | Freq: Every day | ORAL | Status: DC

## 2017-12-15 MED ORDER — VANCOMYCIN HCL IN DEXTROSE 1-5 GM/200ML-% IV SOLN: 1000.0000 mg | Freq: Two times a day (BID) | INTRAVENOUS | Status: DC

## 2017-12-15 MED ORDER — VITAMIN C 500 MG PO TABS
250.0000 mg | ORAL_TABLET | Freq: Two times a day (BID) | ORAL | Status: DC
  Administered 2017-12-15 – 2017-12-16 (×2): 250 mg via ORAL
  Filled 2017-12-15 (×2): qty 1

## 2017-12-15 MED ORDER — SODIUM CHLORIDE 0.9% IV SOLUTION
Freq: Once | INTRAVENOUS | Status: AC
  Administered 2017-12-15: 14:00:00 via INTRAVENOUS

## 2017-12-15 MED ORDER — NICOTINE 21 MG/24HR TD PT24
21.0000 mg | MEDICATED_PATCH | Freq: Every day | TRANSDERMAL | Status: DC
  Administered 2017-12-15 – 2017-12-16 (×2): 21 mg via TRANSDERMAL
  Filled 2017-12-15 (×2): qty 1

## 2017-12-15 MED ORDER — FOLIC ACID 1 MG PO TABS
1.0000 mg | ORAL_TABLET | Freq: Every day | ORAL | Status: DC
  Administered 2017-12-15 – 2017-12-16 (×2): 1 mg via ORAL
  Filled 2017-12-15 (×2): qty 1

## 2017-12-15 MED ORDER — POTASSIUM CHLORIDE CRYS ER 20 MEQ PO TBCR
40.0000 meq | EXTENDED_RELEASE_TABLET | Freq: Once | ORAL | Status: AC
  Administered 2017-12-15: 14:00:00 40 meq via ORAL
  Filled 2017-12-15: qty 2

## 2017-12-15 MED ORDER — ADULT MULTIVITAMIN LIQUID CH
15.0000 mL | Freq: Every day | ORAL | Status: DC
  Administered 2017-12-15 – 2017-12-16 (×2): 15 mL via ORAL
  Filled 2017-12-15 (×2): qty 15

## 2017-12-15 MED ORDER — IOHEXOL 300 MG/ML  SOLN
75.0000 mL | Freq: Once | INTRAMUSCULAR | Status: AC | PRN
  Administered 2017-12-15: 10:00:00 75 mL via INTRAVENOUS

## 2017-12-15 NOTE — Progress Notes (Signed)
Initial Nutrition Assessment  DOCUMENTATION CODES:   Severe malnutrition in context of chronic illness  INTERVENTION:   Ensure Enlive po TID, each supplement provides 350 kcal and 20 grams of protein  Magic cup TID with meals, each supplement provides 290 kcal and 9 grams of protein  Liquid MVI daily  Folic acid '1mg'$  daily   Thiamine '100mg'$  po daily   Vitamin C '250mg'$  po BID   Pt at refeeding risk; recommend monitor K, Mg and P labs once oral intake improved  NUTRITION DIAGNOSIS:   Severe Malnutrition related to chronic illness(liver and lung masses suspected malignancy ) as evidenced by severe muscle depletion, severe fat depletion.  GOAL:   Patient will meet greater than or equal to 90% of their needs  MONITOR:   PO intake, Supplement acceptance, Weight trends, Labs, I & O's, Skin  REASON FOR ASSESSMENT:   Malnutrition Screening Tool    ASSESSMENT:   72 y.o. gentleman with lung and liver lesions worrisome for primary lung cancer.    Met with pt in room today. Pt is a poor historian but reports "I am doing fine". Per pt's family members at bedside, pt with poor appetite and oral intake pta. Family also reports significant weight loss allthough they are unsure of how much. There is no recent weight history in chart to confirm weight loss. Pt does have severe muscle and fat depletions. Pt reports that he likes chocolate Ensure. Pt is currently eating <25% of meals. RD will order supplements and vitamins to help pt meet his estimated needs. Pt likely at high refeeding risk; recommend monitor K, Mg and P labs daily.      Medications reviewed and include: Synthroid, nicotine, NaCl '@100ml'$ /hr, cefepime, Mg sulfate  Labs reviewed: Na 134(L), K 3.1(L), Ca 7.8(L) adj. 9.08 wnl, Mg 1.5(L), Alk Phos 141(H), alb 2.4(L), AST 46(H), tbili 1.3(H) Folate 6.4- 10/8 Wbc- 15.3(H), Hgb 6.9(L), Hct 21.4(L), MCV 77.3(L), MCH 25.0(L)  NUTRITION - FOCUSED PHYSICAL EXAM:    Most Recent Value   Orbital Region  Moderate depletion  Upper Arm Region  Severe depletion  Thoracic and Lumbar Region  Severe depletion  Buccal Region  Severe depletion  Temple Region  Severe depletion  Clavicle Bone Region  Severe depletion  Clavicle and Acromion Bone Region  Severe depletion  Scapular Bone Region  Severe depletion  Dorsal Hand  Severe depletion  Patellar Region  Severe depletion  Anterior Thigh Region  Severe depletion  Posterior Calf Region  Severe depletion  Edema (RD Assessment)  None  Hair  Reviewed  Eyes  Reviewed  Mouth  Reviewed  Skin  Reviewed  Nails  Reviewed     Diet Order:   Diet Order            Diet regular Room service appropriate? Yes; Fluid consistency: Thin  Diet effective now             EDUCATION NEEDS:   Education needs have been addressed  Skin:  Skin Assessment: Reviewed RN Assessment  Last BM:  10/5  Height:   Ht Readings from Last 1 Encounters:  12/14/17 '6\' 4"'$  (1.93 m)    Weight:   Wt Readings from Last 1 Encounters:  12/14/17 66.4 kg    Ideal Body Weight:  91.8 kg  BMI:  Body mass index is 17.82 kg/m.  Estimated Nutritional Needs:   Kcal:  2000-2300kcal/day   Protein:  100-113g/day   Fluid:  >1.7L/day   Koleen Distance MS, RD, LDN Pager #-  [phone] Office#- [phone] After Hours Pager: [phone]

## 2017-12-15 NOTE — OR Nursing (Signed)
Dr [facility] requested Radiology nurse to schedule pt for US liver biopsy tomorrow. He discussed case with Dr Mike Gip prior to conversation. Pt nurse called to request order for procedure so we can put pt on ultrasounds schedule 0830 tomorrow am. Pt also needs to be NPO after midnight except for sips water with meds.

## 2017-12-15 NOTE — Progress Notes (Signed)
[facility] NAME: Shane Russo    MR#:  [id number]  DATE OF BIRTH:  1946/01/01  SUBJECTIVE:   Patient presented to the hospital due to weakness, weight loss and noted to have CT scan findings suggestive of liver metastases and also pulmonary nodules.  CT chest today showing a lung mass.  Patient also noted to be anemic and hemoglobin 9-6.9.  No evidence of melena or hematochezia.  Patient's family is at bedside, patient denies any significant complaints other than some generalized weakness.  REVIEW OF SYSTEMS:    Review of Systems  Constitutional: Positive for malaise/fatigue and weight loss (30 lbs over 6 months). Negative for chills and fever.  HENT: Negative for congestion and tinnitus.   Eyes: Negative for blurred vision and double vision.  Respiratory: Negative for cough, shortness of breath and wheezing.   Cardiovascular: Negative for chest pain, orthopnea and PND.  Gastrointestinal: Negative for abdominal pain, diarrhea, nausea and vomiting.  Genitourinary: Negative for dysuria and hematuria.  Neurological: Positive for weakness (Generalized). Negative for dizziness, sensory change and focal weakness.  All other systems reviewed and are negative.   Nutrition: Regular Tolerating Diet: Yes Tolerating PT: Ambulatory  DRUG ALLERGIES:  No Known Allergies  VITALS:  Blood pressure 120/85, pulse 89, temperature 98.1 F (36.7 C), temperature source Oral, resp. rate 18, height '6\' 4"'$  (1.93 m), weight 66.4 kg, SpO2 100 %.  PHYSICAL EXAMINATION:   Physical Exam  GENERAL:  72 y.o.-year-old patient lying in bed with a flat affect in NAD.  EYES: Pupils equal, round, reactive to light and accommodation. No scleral icterus. Extraocular muscles intact.  HEENT: Head atraumatic, normocephalic. Oropharynx and nasopharynx clear.  NECK:  Supple, no jugular venous distention. No thyroid enlargement, no tenderness.  LUNGS: Normal  breath sounds bilaterally, no wheezing, rales, rhonchi. No use of accessory muscles of respiration.  CARDIOVASCULAR: S1, S2 normal. No murmurs, rubs, or gallops.  ABDOMEN: Soft, nontender, nondistended. Bowel sounds present. No organomegaly or mass.  EXTREMITIES: No cyanosis, clubbing or edema b/l.    NEUROLOGIC: Cranial nerves II through XII are intact. No focal Motor or sensory deficits b/l.   PSYCHIATRIC: The patient is alert and oriented x 3.  SKIN: No obvious rash, lesion, or ulcer.    LABORATORY PANEL:   CBC Recent Labs  Lab 12/15/17 0530  WBC 15.3*  HGB 6.9*  HCT 21.4*  PLT 446*   ------------------------------------------------------------------------------------------------------------------  Chemistries  Recent Labs  Lab 12/15/17 0530  NA 134*  K 3.1*  CL 102  CO2 25  GLUCOSE 97  BUN 9  CREATININE 0.61  CALCIUM 7.8*  MG 1.5*  AST 46*  ALT 14  ALKPHOS 141*  BILITOT 1.3*   ------------------------------------------------------------------------------------------------------------------  Cardiac Enzymes Recent Labs  Lab 12/14/17 1024  TROPONINI <0.03   ------------------------------------------------------------------------------------------------------------------  RADIOLOGY:  Dg Chest 2 View  Result Date: 12/14/2017 CLINICAL DATA:  Weakness. Leukocytosis. Anemia. EXAM: CHEST - 2 VIEW COMPARISON:  None. FINDINGS: Normal sized heart. Tortuous aorta. 4.5 cm mass in the superior segment of the left lower lobe. Multiple additional smaller bilateral lung nodules. Unremarkable bones. IMPRESSION: 4.5 cm mass in the superior segment of the left lower lobe with multiple additional smaller bilateral lung nodules. This is concerning for metastases with a possible primary lung carcinoma. An infectious process is less likely. Further evaluation with a chest CT with contrast is recommended. Electronically Signed   By: Percell Locus.D.  On: 12/14/2017 13:10   Ct  Chest W Contrast  Result Date: 12/15/2017 CLINICAL DATA:  Inpatient. Left lung mass with bilateral pulmonary nodules on chest radiograph. Multiple liver masses on CT abdomen. EXAM: CT CHEST WITH CONTRAST TECHNIQUE: Multidetector CT imaging of the chest was performed during intravenous contrast administration. CONTRAST:  52m OMNIPAQUE IOHEXOL 300 MG/ML  SOLN COMPARISON:  12/14/2017 chest radiograph and CT abdomen/pelvis. FINDINGS: Cardiovascular: Normal heart size. Trace pericardial effusion/thickening. Three-vessel coronary atherosclerosis. Atherosclerotic nonaneurysmal thoracic aorta. Normal caliber pulmonary arteries. No central pulmonary emboli. Mediastinum/Nodes: No discrete thyroid nodules. Unremarkable esophagus. No pathologically enlarged axillary, mediastinal or hilar lymph nodes. Lungs/Pleura: No pneumothorax. Trace dependent right pleural effusion. No left pleural effusion. Mild centrilobular emphysema. Dominant 4.8 x 3.5 cm solid central left lower lobe lung mass (series 3/image 74). Numerous (greater than 20) solid pulmonary nodules of various sizes scattered throughout both lungs, largest 2.8 cm in the medial left upper lobe (series 3/image 87) and 2.4 cm in the right middle lobe (series 3/image 78). Upper abdomen: Partial visualization of numerous bulky hypoenhancing masses scattered throughout the liver, not appreciably changed from CT abdomen study performed 1 day prior. Right adrenal 2.6 cm nodule with density 57 HU, unchanged. Musculoskeletal:  No aggressive appearing focal osseous lesions. IMPRESSION: 1. Dominant 4.8 cm solid central left lower lobe lung mass, compatible with primary bronchogenic carcinoma. 2. Numerous (greater than 20) solid pulmonary nodules of various sizes scattered throughout both lungs, compatible with pulmonary metastases. 3. No thoracic adenopathy. 4. Trace dependent right pleural effusion. 5. Redemonstration of multiple bulky liver metastases. 6. Indeterminate right  adrenal nodule suspicious for adrenal metastasis. 7. Three-vessel coronary atherosclerosis. Aortic Atherosclerosis (ICD10-I70.0) and Emphysema (ICD10-J43.9). Electronically Signed   By: JIlona SorrelM.D.   On: 12/15/2017 11:58   Ct Abdomen Pelvis W Contrast  Result Date: 12/14/2017 CLINICAL DATA:  Low hemoglobin, elevated white blood cell count, weakness and severe weight loss EXAM: CT ABDOMEN AND PELVIS WITH CONTRAST TECHNIQUE: Multidetector CT imaging of the abdomen and pelvis was performed using the standard protocol following bolus administration of intravenous contrast. CONTRAST:  102mISOVUE-300 IOPAMIDOL (ISOVUE-300) INJECTION 61% COMPARISON:  None. FINDINGS: Lower chest: Multiple lung nodules are present at the lung bases left-greater-than-right most consistent with metastatic involvement of the lungs. No pleural effusion is seen. The heart is within normal limits in size. No pericardial effusion is evident. Hepatobiliary: There are multiple lesions throughout both the right and left lobes of liver of varying sizes, consistent with diffuse metastatic involvement of the liver. One of the largest lesions involves the medial segment of the left lobe measuring approximately 9.4 x 9.8 cm. No intrahepatic ductal dilatation is seen. Gallbladder is not visualized. Pancreas: The pancreas appears atrophic. The pancreatic duct is not dilated. Spleen: The spleen is unremarkable. Adrenals/Urinary Tract: The adrenal glands appear normal. The kidneys enhance with no focal abnormality and no calculi are evident. On delayed images, the pelvocaliceal systems appear normal. No hydronephrosis is seen. The ureters appear normal in caliber. The urinary bladder is decompressed and cannot be evaluated. Stomach/Bowel: The stomach is very distended with oral contrast and air. No definite abnormality of the small bowel is seen. There is a moderate amount of feces particularly in the rectosigmoid region. No edema of the rectal  mucosa is evident. The hepatic flexure of colon is deviated medially by the large hepatic lesion. The terminal ileum and the appendix appear unremarkable. Vascular/Lymphatic: The abdominal aorta is normal in caliber with moderate abdominal aortic atherosclerosis. No  adenopathy is seen. Reproductive: The prostate is unremarkable. Other: No abdominal wall hernia is seen. There is a small amount of ascites present around the liver dependently layering within the pelvis. Musculoskeletal: There is degenerative disc disease at L3-4 with considerable loss of disc space and slight anterolisthesis of L3 on L4. No lytic or blastic lesion is evident. IMPRESSION: 1. Multiple liver lesions throughout both the right and left lobes consistent with liver metastasis. Multicentric hepatoma cannot be excluded. 2. Multiple lung nodules at the lung bases left-greater-than-right are consistent with metastatic involvement of the lungs. Chest x-ray or CT of the chest would be helpful to assess for possible primary lesion. 3. Small amount of ascites. Electronically Signed   By: Ivar Drape M.D.   On: 12/14/2017 13:47     ASSESSMENT AND PLAN:   72 year old male with past medical history of tobacco abuse, hypertension who presented to the hospital due to generalized weakness and weight loss and noted to have CT scan abdomen pelvis suggestive of metastatic liver disease and also pulmonary nodules.  Patient also noted to be anemic.  1.  Sepsis-patient met criteria on admission given his leukocytosis, tachycardia mildly elevated lactic acid.  Source was thought to be pulmonary. - Patient was on IV vancomycin, cefepime.  MRSA PCR negative therefore vancomycin discontinued. - Follow cultures which are currently negative.  2.  Metastatic disease/unknown primary-patient had a CT abdomen pelvis yesterday which showed multiple liver lesions and pulmonary nodules.  CT chest today showing a lung mass. - Suspicion this is underlying lung  cancer with metastatic disease.  Patient has a long history of tobacco abuse. -Oncology consulted and plan for ultrasound-guided biopsy of the liver possibly tomorrow. - Appreciate oncology and pulmonary input.  Further plan for treatment once biopsy obtained.  3.  Anemia-this is a combination of anemia of chronic disease and iron deficiency. - Patient's hemoglobin down to 6.9 today and will transfuse 1 unit of packed red blood cells.  Follow hemoglobin.  4.  Hypothyroidism-continue Synthroid.  5.  COPD-no acute exacerbation  6. Tobacco abuse-continue nicotine patch.  7. Hypokalemia - cont. Potassium supplementation and will repeat level in a.m.   All the records are reviewed and case discussed with Care Management/Social Worker. Management plans discussed with the patient, family and they are in agreement.  CODE STATUS: Full code  DVT Prophylaxis: Lovenox  TOTAL TIME TAKING CARE OF THIS PATIENT: 30 minutes.   POSSIBLE D/C IN 1-2 DAYS, DEPENDING ON CLINICAL CONDITION.   Henreitta Leber M.D on 12/15/2017 at 3:52 PM  Between 7am to 6pm - Pager - [phone]  After 6pm go to [email or web address] - [employer] [facility]  Office  [phone]  CC: Primary care physician; Center, [facility]

## 2017-12-15 NOTE — Progress Notes (Signed)
Pharmacy Antibiotic Note  Shane Russo is a 72 y.o. male admitted on 12/14/2017 with sepsis.  Pharmacy has been consulted for vancomycin and cefepime dosing. He was referred by an outside clinic for anemia and leukocytosis. His lactic acid is also elevated. Liver and lung masses were discovered in imaging and oncology is being consulted.  Plan: 10/8 Weight updated in Epic. Will adjust dose to Vancomycin 1000mg  IV every 12 hours.  Goal trough 15-20 mcg/mL. Calculated trough @ [facility] is 18.3.Trough level ordered on 10/10 @ 0530  New Kinetics:  K 0.069  T1/2: 10.4 h, Vd 46.5L  Continue Cefepime 2 grams IV every 8 hours  Height: 6\' 4"  (193 cm) Weight: 146 lb 6.2 oz (66.4 kg) IBW/kg (Calculated) : 86.8  Temp (24hrs), Avg:98.3 F (36.8 C), Min:97.8 F (36.6 C), Max:98.9 F (37.2 C)  Recent Labs  Lab 12/14/17 1024 12/14/17 1153 12/14/17 1751 12/15/17 0530  WBC 18.2*  --   --  15.3*  CREATININE 0.70  --   --  0.61  LATICACIDVEN  --  3.7* 2.9*  --     Estimated Creatinine Clearance: 78.4 mL/min (by C-G formula based on SCr of 0.61 mg/dL).    Antimicrobials this admission: Vancomycin  10/7 >>  cefepime 10/7 >>   Microbiology results: 10/7 BCx: pending 10/7 UCx: pending   Thank you for allowing pharmacy to be a part of this patient's care.  Shane Russo, PharmD, BCPS Clinical Pharmacist 12/15/2017 10:04 AM

## 2017-12-15 NOTE — Consult Note (Signed)
Reason for Consult: Evaluation of lung mass with metastasis. Referring Physician: [name] is an 73 y.o. male.  HPI: 72 year old current smoker who presented to [facility] due to weakness, anemia and weight loss. The patient is a very terse historian and tends to minimize symptomatology. With his permission, daughter-in-law is present during the interview and hs provided some extra details to the history. Patient apparently has been losing weight more pronounced over the last two months but overall appears to have lost approximately 20 to 22 pounds over the last year. He has very poor appetite. He complains of weakness but has not had any joint pain allergist. He has had no fevers, chills or sweats. He has not had abdominal pain, nausea vomiting or diarrhea. Hardly any cough at all over the last few weeks though previously he was treated for potential infection. No hemoptysis.  He has been treated at [facility] center over the last two weeks for weight loss and weakness. He was noted to have an elevated white count and anemia. He presented to [facility] after he was referred to the emergency room by the community center. He has a profound anemia with H/H of 6.9 and 21.4 respectively. Patient underwent a CT scan of the abdomen and pelvis which revealed multiple liver lesions consistent with metastatic disease. The visible portions of the lung also showed multiple nodules. He underwent CT scan of the chest today that shows a large left lower lobe superior segment mass with multiple metastatic deposits throughout the lungs. He is currently on antibiotic therapy  Patient voices no other complaint except the fact that he wants to go home.  Imaging and laboratory data have been independently reviewed.  Past Medical History:  Diagnosis Date  . Hypertension     Past Surgical History:  Procedure Laterality Date  . none      History reviewed. No pertinent family history.  Social History:   reports that he has been smoking. He has never used smokeless tobacco. He reports that he drinks alcohol. He reports that he does not use drugs. 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 The patient imbibes alcohol however, he will not elaborate on how much. Per the daughter-in-law, she believes his ingestion of alcohol is quite heavy.  Allergies: No Known Allergies  Medications: I have reviewed the patient's current medications.  Results for orders placed or performed during the hospital encounter of 12/14/17 (from the past 48 hour(s))  Basic metabolic panel     Status: Abnormal   Collection Time: 12/14/17 10:24 AM  Result Value Ref Range   Sodium 137 135 - 145 mmol/L   Potassium 3.0 (L) 3.5 - 5.1 mmol/L   Chloride 97 (L) 98 - 111 mmol/L   CO2 24 22 - 32 mmol/L   Glucose, Bld 137 (H) 70 - 99 mg/dL   BUN 10 8 - 23 mg/dL   Creatinine, Ser 0.70 0.61 - 1.24 mg/dL   Calcium 8.9 8.9 - 10.3 mg/dL   GFR calc non Af Amer >60 >60 mL/min   GFR calc Af Amer >60 >60 mL/min    Comment: (NOTE) The eGFR has been calculated using the CKD EPI equation. This calculation has not been validated in all clinical situations. eGFR's persistently <60 mL/min signify possible Chronic Kidney Disease.    Anion gap 16 (H) 5 -  15    Comment: Performed at [facility], [address] [place], [place] [address]  CBC     Status: Abnormal   Collection Time: 12/14/17 10:24 AM  Result Value Ref Range   WBC 18.2 (H) 3.8 - 10.6 K/uL   RBC 3.77 (L) 4.40 - 5.90 MIL/uL   Hemoglobin 9.1 (L) 13.0 - 18.0 g/dL   HCT 29.2 (L) 40.0 - 52.0 %   MCV 77.6 (L) 80.0 - 100.0 fL   MCH 24.2 (L) 26.0 - 34.0 pg   MCHC 31.2 (L) 32.0 - 36.0 g/dL   RDW 15.8 (H) 11.5 - 14.5 %   Platelets 574 (H) 150 - 440 K/uL    Comment: Performed at [facility], [address], [place], [place] [address]  Type and  screen [place]     Status: None (Preliminary result)   Collection Time: 12/14/17 10:24 AM  Result Value Ref Range   ABO/RH(D) A POS    Antibody Screen NEG    Sample Expiration      12/17/2017 Performed at [facility], [address], [place], [place] [address]    Unit Number [id number]    Blood Component Type RBC LR PHER1    Unit division 00    Status of Unit ALLOCATED    Transfusion Status OK TO TRANSFUSE    Crossmatch Result Compatible   Troponin I     Status: None   Collection Time: 12/14/17 10:24 AM  Result Value Ref Range   Troponin I <0.03 <0.03 ng/mL    Comment: Performed at [facility], [address] [place], [place] [address]  Culture, blood (routine x 2)     Status: None (Preliminary result)   Collection Time: 12/14/17 11:53 AM  Result Value Ref Range   Specimen Description BLOOD RIGHT ANTECUBITAL    Special Requests      BOTTLES DRAWN AEROBIC AND ANAEROBIC Blood Culture results may not be optimal due to an excessive volume of blood received in culture bottles   Culture      NO GROWTH < 24 HOURS Performed at [facility], [address], [place], [place] [address]    Report Status PENDING   Lactic acid, plasma     Status: Abnormal   Collection Time: 12/14/17 11:53 AM  Result Value Ref Range   Lactic Acid, Venous 3.7 (HH) 0.5 - 1.9 mmol/L    Comment: CRITICAL RESULT CALLED TO, READ BACK BY AND VERIFIED WITH Bristol WALKER AT 4627 12/14/17 DAS Performed at [facility], [address] [place],  [address]   Culture, blood (routine x 2)     Status: None (Preliminary result)   Collection Time: 12/14/17 11:58 AM  Result Value Ref Range   Specimen Description BLOOD BLOOD LEFT HAND    Special Requests      BOTTLES DRAWN AEROBIC AND ANAEROBIC Blood Culture adequate volume   Culture      NO GROWTH < 24 HOURS Performed at [facility], [address] [place],  [address]     Report Status PENDING   Lactic acid, plasma     Status: Abnormal   Collection Time: 12/14/17  5:51 PM  Result Value Ref Range   Lactic Acid, Venous 2.9 (HH) 0.5 - 1.9 mmol/L    Comment: CRITICAL RESULT CALLED TO, READ BACK BY AND VERIFIED WITH BRANDY MILLER AT 1840 12/14/2017.  TFK Performed at [facility], [place], [place]  [address]   Urinalysis, Complete w Microscopic     Status: Abnormal   Collection Time: 12/15/17 12:53 AM  Result Value Ref Range   Color, Urine AMBER (A) YELLOW    Comment: BIOCHEMICALS MAY BE AFFECTED BY COLOR   APPearance CLEAR (A) CLEAR   Specific Gravity, Urine >1.046 (H) 1.005 - 1.030   pH 5.0 5.0 - 8.0   Glucose, UA NEGATIVE NEGATIVE mg/dL   Hgb urine dipstick NEGATIVE NEGATIVE   Bilirubin Urine NEGATIVE NEGATIVE   Ketones, ur NEGATIVE NEGATIVE mg/dL   Protein, ur NEGATIVE NEGATIVE mg/dL   Nitrite NEGATIVE NEGATIVE   Leukocytes, UA NEGATIVE NEGATIVE   RBC / HPF 0-5 0 - 5 RBC/hpf   WBC, UA 0-5 0 - 5 WBC/hpf   Bacteria, UA NONE SEEN NONE SEEN   Squamous Epithelial / LPF 0-5 0 - 5   Mucus PRESENT     Comment: Performed at [facility], [address], [place], [place] [address]  Ferritin     Status: Abnormal   Collection Time: 12/15/17  5:30 AM  Result Value Ref Range   Ferritin 1,326 (H) 24 - 336 ng/mL    Comment: Performed at [facility], [place], [place] [address]  Iron and TIBC     Status: Abnormal   Collection Time: 12/15/17  5:30 AM  Result Value Ref Range   Iron 23 (L) 45 - 182 ug/dL   TIBC 134 (L) 250 - 450 ug/dL   Saturation Ratios 17 (L) 17.9 - 39.5 %   UIBC 111 ug/dL    Comment: Performed at [facility], [address] [place], [place] [address]  Vitamin B12     Status: None   Collection Time: 12/15/17  5:30 AM  Result Value Ref Range   Vitamin B-12 553 180 - 914 pg/mL    Comment: (NOTE) This assay is not validated for testing neonatal or myeloproliferative  syndrome specimens for Vitamin B12 levels. Performed at [facility], [place] [address] [place], [place] [address]   Folate     Status: None   Collection Time: 12/15/17  5:30 AM  Result Value Ref Range   Folate 6.4 >5.9 ng/mL    Comment: Performed at [facility], [address] [place], [place] [address]  CBC with Differential     Status: Abnormal   Collection Time: 12/15/17  5:30 AM  Result Value Ref Range   WBC 15.3 (H) 3.8 - 10.6 K/uL   RBC 2.77 (L) 4.40 - 5.90 MIL/uL   Hemoglobin 6.9 (L) 13.0 - 18.0 g/dL    Comment: RESULT REPEATED AND VERIFIED   HCT 21.4 (L) 40.0 - 52.0 %   MCV 77.3 (L) 80.0 - 100.0 fL   MCH 25.0 (L) 26.0 - 34.0 pg   MCHC 32.4 32.0 - 36.0 g/dL   RDW 15.7 (H) 11.5 - 14.5 %   Platelets 446 (H) 150 - 440 K/uL   Neutrophils Relative % 82 %   Neutro Abs 12.6 (H) 1.4 - 6.5 K/uL   Lymphocytes Relative 5 %   Lymphs Abs 0.8 (L) 1.0 - 3.6 K/uL   Monocytes Relative 11 %   Monocytes Absolute 1.7 (H) 0.2 - 1.0 K/uL   Eosinophils Relative 1 %   Eosinophils Absolute 0.1 0 - 0.7 K/uL   Basophils Relative 1 %   Basophils Absolute 0.1 0 - 0.1 K/uL    Comment: Performed at [facility], [address] [place],  [place] [address]  Protime-INR     Status: None   Collection Time: 12/15/17  5:30 AM  Result Value Ref Range   Prothrombin Time 14.7 11.4 - 15.2 seconds   INR 1.16     Comment: Performed at [facility], [address] [place], [place] [address]  Comprehensive metabolic panel     Status: Abnormal   Collection Time: 12/15/17  5:30 AM  Result Value Ref Range   Sodium 134 (L) 135 - 145 mmol/L   Potassium 3.1 (L) 3.5 - 5.1 mmol/L   Chloride 102 98 - 111 mmol/L   CO2 25 22 - 32 mmol/L   Glucose, Bld 97 70 - 99 mg/dL   BUN 9 8 - 23 mg/dL   Creatinine, Ser 0.61 0.61 - 1.24 mg/dL   Calcium 7.8 (L) 8.9 - 10.3 mg/dL   Total Protein 6.1 (L) 6.5 - 8.1 g/dL   Albumin 2.4 (L) 3.5 - 5.0 g/dL   AST 46 (H) 15 - 41 U/L   ALT 14 0 - 44  U/L   Alkaline Phosphatase 141 (H) 38 - 126 U/L   Total Bilirubin 1.3 (H) 0.3 - 1.2 mg/dL   GFR calc non Af Amer >60 >60 mL/min   GFR calc Af Amer >60 >60 mL/min    Comment: (NOTE) The eGFR has been calculated using the CKD EPI equation. This calculation has not been validated in all clinical situations. eGFR's persistently <60 mL/min signify possible Chronic Kidney Disease.    Anion gap 7 5 - 15    Comment: Performed at [facility], [address] [place], [place] [address]  ABO/Rh     Status: None   Collection Time: 12/15/17  5:30 AM  Result Value Ref Range   ABO/RH(D)      A POS Performed at [facility], [address] [place], [place] [address]   Prepare RBC     Status: None   Collection Time: 12/15/17 11:30 AM  Result Value Ref Range   Order Confirmation      ORDER PROCESSED BY BLOOD BANK Performed at [facility], [address] [place], [place] [address]     Dg Chest 2 View  Result Date: 12/14/2017 CLINICAL DATA:  Weakness. Leukocytosis. Anemia. EXAM: CHEST - 2 VIEW COMPARISON:  None. FINDINGS: Normal sized heart. Tortuous aorta. 4.5 cm mass in the superior segment of the left lower lobe. Multiple additional smaller bilateral lung nodules. Unremarkable bones. IMPRESSION: 4.5 cm mass in the superior segment of the left lower lobe with multiple additional smaller bilateral lung nodules. This is concerning for metastases with a possible primary lung carcinoma. An infectious process is less likely. Further evaluation with a chest CT with contrast is recommended. Electronically Signed   By: Claudie Revering M.D.   On: 12/14/2017 13:10   Ct Abdomen Pelvis W Contrast  Result Date: 12/14/2017 CLINICAL DATA:  Low hemoglobin, elevated white blood cell count, weakness and severe weight loss EXAM: CT ABDOMEN AND PELVIS WITH CONTRAST TECHNIQUE: Multidetector CT imaging of the abdomen and pelvis was performed using the standard protocol following bolus  administration of intravenous contrast. CONTRAST:  127m ISOVUE-300 IOPAMIDOL (ISOVUE-300) INJECTION 61% COMPARISON:  None. FINDINGS: Lower chest: Multiple lung nodules are present at the lung bases left-greater-than-right most consistent with metastatic involvement of the lungs. No pleural effusion is seen. The heart is within normal limits in size. No pericardial effusion is evident. Hepatobiliary: There are multiple lesions throughout both the right and left  lobes of liver of varying sizes, consistent with diffuse metastatic involvement of the liver. One of the largest lesions involves the medial segment of the left lobe measuring approximately 9.4 x 9.8 cm. No intrahepatic ductal dilatation is seen. Gallbladder is not visualized. Pancreas: The pancreas appears atrophic. The pancreatic duct is not dilated. Spleen: The spleen is unremarkable. Adrenals/Urinary Tract: The adrenal glands appear normal. The kidneys enhance with no focal abnormality and no calculi are evident. On delayed images, the pelvocaliceal systems appear normal. No hydronephrosis is seen. The ureters appear normal in caliber. The urinary bladder is decompressed and cannot be evaluated. Stomach/Bowel: The stomach is very distended with oral contrast and air. No definite abnormality of the small bowel is seen. There is a moderate amount of feces particularly in the rectosigmoid region. No edema of the rectal mucosa is evident. The hepatic flexure of colon is deviated medially by the large hepatic lesion. The terminal ileum and the appendix appear unremarkable. Vascular/Lymphatic: The abdominal aorta is normal in caliber with moderate abdominal aortic atherosclerosis. No adenopathy is seen. Reproductive: The prostate is unremarkable. Other: No abdominal wall hernia is seen. There is a small amount of ascites present around the liver dependently layering within the pelvis. Musculoskeletal: There is degenerative disc disease at L3-4 with considerable  loss of disc space and slight anterolisthesis of L3 on L4. No lytic or blastic lesion is evident. IMPRESSION: 1. Multiple liver lesions throughout both the right and left lobes consistent with liver metastasis. Multicentric hepatoma cannot be excluded. 2. Multiple lung nodules at the lung bases left-greater-than-right are consistent with metastatic involvement of the lungs. Chest x-ray or CT of the chest would be helpful to assess for possible primary lesion. 3. Small amount of ascites. Electronically Signed   By: Ivar Drape M.D.   On: 12/14/2017 13:47   The above date I reviewed. In addition chest CT just performed has been reviewed and discussed with the patient and his daughter-in-law they were shown the images. Patient has a large solid central left lower lobe lung mass with numerous solid pulmonary nodules of both lungs. There are multiple liver lesions consistent with metastatic disease. There also appears to be a right adrenal metastatic deposit. Carcinoma until proven otherwise.  ROS Blood pressure 104/65, pulse 97, temperature 98.3 F (36.8 C), temperature source Oral, resp. rate 18, height '6\' 4"'$  (1.93 m), weight 66.4 kg, SpO2 100 %. Physical Exam  Nursing note and vitals reviewed. Constitutional: He is oriented to person, place, and time. No distress.  Thin/cachectic African-American male, apathetic. No respiratory distress.  HENT:  Head: Normocephalic and atraumatic.  Right Ear: External ear normal.  Left Ear: External ear normal.  Nose: Nose normal.  Mouth/Throat: No oropharyngeal exudate.  Very poor dentition, missing teeth. Temporal wasting noted  Eyes: Pupils are equal, round, and reactive to light. EOM are normal. No scleral icterus.  Neck: Normal range of motion. Neck supple. No JVD present. No tracheal deviation present. No thyromegaly present.  Cardiovascular: Normal rate, regular rhythm and intact distal pulses. Exam reveals no gallop and no friction rub.  No murmur  heard. Respiratory: Effort normal. No stridor. No respiratory distress. He has no wheezes. He has no rales. He exhibits no tenderness.  Distant breath sounds.  GI: Soft. Bowel sounds are normal. He exhibits no distension. There is no tenderness.  Firm liver, nontender, liver edge 4 finger breaths below costal  margin.  Musculoskeletal: Normal range of motion. He exhibits no edema.  Decreased muscle mass  throughout.  Lymphadenopathy:    He has no cervical adenopathy.  Neurological: He is alert and oriented to person, place, and time. No cranial nerve deficit.  Skin: Skin is warm and dry. No rash noted. He is not diaphoretic. No erythema.  Psychiatric:  Flat affect. Some psychomotor retardation noted.    Assessment/Plan:  1) Left lower lobe superior segment mass with satellite lesions indicative of metastatic disease. Metastatic disease to the live and potentially adrenals.. 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 Other possibilities could include adenocarcinoma. The safest and more accessible biopsy site would be the liver. Recommend biopsy of a liver lesion by interventional radiology.  2) Significant protein calorie malnutrition due to advanced cancer.  3) Anemia: being evaluated. This may be multifactorial and it may be related to your nutritional status and alcohol use aggravated by advanced carcinoma (chronic disease).  You for allowing [facility] to participate in this patient's care.  Discussed with Dr. Rosana Hoes 12/15/2017, 11:59 AM

## 2017-12-16 ENCOUNTER — Inpatient Hospital Stay: Payer: Medicare Other

## 2017-12-16 LAB — CBC
HCT: 27.9 % — ABNORMAL LOW (ref 39.0–52.0)
HEMOGLOBIN: 9.1 g/dL — AB (ref 13.0–17.0)
MCH: 25.3 pg — AB (ref 26.0–34.0)
MCHC: 32.6 g/dL (ref 30.0–36.0)
MCV: 77.5 fL — AB (ref 80.0–100.0)
Platelets: 495 10*3/uL — ABNORMAL HIGH (ref 150–400)
RBC: 3.6 MIL/uL — AB (ref 4.22–5.81)
RDW: 15.2 % (ref 11.5–15.5)
WBC: 18.4 10*3/uL — ABNORMAL HIGH (ref 4.0–10.5)
nRBC: 0 % (ref 0.0–0.2)

## 2017-12-16 LAB — BASIC METABOLIC PANEL
Anion gap: 11 (ref 5–15)
BUN: 6 mg/dL — ABNORMAL LOW (ref 8–23)
CALCIUM: 8.2 mg/dL — AB (ref 8.9–10.3)
CO2: 23 mmol/L (ref 22–32)
Chloride: 100 mmol/L (ref 98–111)
Creatinine, Ser: 0.64 mg/dL (ref 0.61–1.24)
GFR calc Af Amer: >60 mL/min (ref >60)
GLUCOSE: 115 mg/dL — AB (ref 70–99)
Potassium: 3.2 mmol/L — ABNORMAL LOW (ref 3.5–5.1)
Sodium: 134 mmol/L — ABNORMAL LOW (ref 135–145)

## 2017-12-16 LAB — TYPE AND SCREEN
ABO/RH(D): A POS
Antibody Screen: NEGATIVE
UNIT DIVISION: 0

## 2017-12-16 LAB — BPAM RBC
BLOOD PRODUCT EXPIRATION DATE: 201910252359
ISSUE DATE / TIME: 201910081421
UNIT TYPE AND RH: 6200

## 2017-12-16 LAB — CEA: CEA: 444 ng/mL — ABNORMAL HIGH (ref 0.0–4.7)

## 2017-12-16 LAB — AFP TUMOR MARKER: AFP, Serum, Tumor Marker: 9 ng/mL — ABNORMAL HIGH (ref 0.0–8.3)

## 2017-12-16 MED ORDER — FENTANYL CITRATE (PF) 100 MCG/2ML IJ SOLN
INTRAMUSCULAR | Status: AC
  Filled 2017-12-16: qty 4

## 2017-12-16 MED ORDER — FENTANYL CITRATE (PF) 100 MCG/2ML IJ SOLN
INTRAMUSCULAR | Status: AC | PRN
  Administered 2017-12-16: 50 ug via INTRAVENOUS

## 2017-12-16 MED ORDER — POTASSIUM CHLORIDE CRYS ER 20 MEQ PO TBCR
40.0000 meq | EXTENDED_RELEASE_TABLET | Freq: Once | ORAL | Status: AC
  Administered 2017-12-16: 40 meq via ORAL
  Filled 2017-12-16: qty 4

## 2017-12-16 MED ORDER — SODIUM CHLORIDE 0.9 % IV SOLN
INTRAVENOUS | Status: AC | PRN
  Administered 2017-12-16: 10 mL/h via INTRAVENOUS

## 2017-12-16 MED ORDER — MIDAZOLAM HCL 5 MG/5ML IJ SOLN
INTRAMUSCULAR | Status: AC
  Filled 2017-12-16: qty 5

## 2017-12-16 MED ORDER — MIDAZOLAM HCL 5 MG/5ML IJ SOLN
INTRAMUSCULAR | Status: AC | PRN
  Administered 2017-12-16: 1 mg via INTRAVENOUS

## 2017-12-16 NOTE — Discharge Summary (Signed)
[facility] NAME: Sipriano Fendley    MR#:  [id number]  DATE OF BIRTH:  11-28-1945  DATE OF ADMISSION:  12/14/2017 ADMITTING PHYSICIAN: Saundra Shelling, MD  DATE OF DISCHARGE: 12/16/2017  4:41 PM  PRIMARY CARE PHYSICIAN: [facility] [place]    ADMISSION DIAGNOSIS:  Hypokalemia [E87.6] Lung mass [R91.8] Liver mass [R16.0] Anemia, unspecified type [D64.9]  DISCHARGE DIAGNOSIS:  Active Problems:   Sepsis ([place])   SECONDARY DIAGNOSIS:   Past Medical History:  Diagnosis Date  . Hypertension     HOSPITAL COURSE:   72 year old male with past medical history of tobacco abuse, hypertension who presented to the hospital due to generalized weakness and weight loss and noted to have CT scan abdomen pelvis suggestive of metastatic liver disease and also pulmonary nodules.  Patient also noted to be anemic.  1.  Sepsis-patient met criteria on admission given his leukocytosis, tachycardia mildly elevated lactic acid.   -Initially patient was placed on broad-spectrum IV antibiotics including vancomycin, cefepime.  Patient's MRSA PCR was negative therefore vancomycin discontinued.  he has been afebrile and he dynamically stable.  Sepsis has now been ruled out and patient is being discharged on no antibiotics.  2.  Metastatic disease/unknown primary-patient had a CT abdomen pelvis  which showed multiple liver lesions and pulmonary nodules.  CT chest today showing a lung mass. -Patient was seen both by pulmonary and also oncology.  Patient underwent a ultrasound-guided biopsy of the liver lesions. - Suspicion is for primary lung cancer with metastatic disease.  Patient is being discharged home with outpatient oncology follow-up with Dr. Mike Gip.\.  3.  Anemia-this is a combination of anemia of chronic disease and iron deficiency. -Patient's hemoglobin dropped down to 6.9 during the hospital he was transfused 1 unit of packed red blood  cells hemoglobin is improved over 9.  He has no clinical evidence of bleeding.  Discussed with oncology and they will follow-up hemoglobin at the cancer center next week.  4.  Hypothyroidism- he will continue Synthroid.  5.  COPD-no acute exacerbation - he did not need Home O2.   6. Tobacco abuse- while in the hospital pt. Was on Nicotine patch.    DISCHARGE CONDITIONS:   Stable.   CONSULTS OBTAINED:  Treatment Team:  Laverle Hobby, MD  DRUG ALLERGIES:  No Known Allergies  DISCHARGE MEDICATIONS:   Allergies as of 12/16/2017   No Known Allergies     Medication List    TAKE these medications   levothyroxine 25 MCG tablet Commonly known as:  SYNTHROID, LEVOTHROID Take 25 mcg by mouth daily.   multivitamin Tabs tablet Take 1 tablet by mouth daily.         DISCHARGE INSTRUCTIONS:   DIET:  Regular diet  DISCHARGE CONDITION:  Stable  ACTIVITY:  Activity as tolerated  OXYGEN:  Home Oxygen: No.   Oxygen Delivery: room air  DISCHARGE LOCATION:  home   If you experience worsening of your admission symptoms, develop shortness of breath, life threatening emergency, suicidal or homicidal thoughts you must seek medical attention immediately by calling 911 or calling your MD immediately  if symptoms less severe.  You Must read complete instructions/literature along with all the possible adverse reactions/side effects for all the Medicines you take and that have been prescribed to you. Take any new Medicines after you have completely understood and accpet all the possible adverse reactions/side effects.   Please note  You were cared for by  a hospitalist during your hospital stay. If you have any questions about your discharge medications or the care you received while you were in the hospital after you are discharged, you can call the unit and asked to speak with the hospitalist on call if the hospitalist that took care of you is not available. Once you are  discharged, your primary care physician will handle any further medical issues. Please note that NO REFILLS for any discharge medications will be authorized once you are discharged, as it is imperative that you return to your primary care physician (or establish a relationship with a primary care physician if you do not have one) for your aftercare needs so that they can reassess your need for medications and monitor your lab values.     Today   No shortness of breath, chest pain, hemoptysis.  Status post ultrasound-guided biopsy of the liver lesions.    VITAL SIGNS:  Blood pressure 111/80, pulse (!) 108, temperature 98.6 F (37 C), resp. rate 20, height '6\' 4"'$  (1.93 m), weight 66.4 kg, SpO2 100 %.  I/O:    Intake/Output Summary (Last 24 hours) at 12/16/2017 1728 Last data filed at 12/16/2017 0100 Gross per 24 hour  Intake -  Output 400 ml  Net -400 ml    PHYSICAL EXAMINATION:   GENERAL:  71 y.o.-year-old patient lying in bed with a flat affect in NAD.  EYES: Pupils equal, round, reactive to light and accommodation. No scleral icterus. Extraocular muscles intact.  HEENT: Head atraumatic, normocephalic. Oropharynx and nasopharynx clear.  NECK:  Supple, no jugular venous distention. No thyroid enlargement, no tenderness.  LUNGS: Normal breath sounds bilaterally, no wheezing, rales, rhonchi. No use of accessory muscles of respiration.  CARDIOVASCULAR: S1, S2 normal. No murmurs, rubs, or gallops.  ABDOMEN: Soft, nontender, nondistended. Bowel sounds present. No organomegaly or mass.  EXTREMITIES: No cyanosis, clubbing or edema b/l.    NEUROLOGIC: Cranial nerves II through XII are intact. No focal Motor or sensory deficits b/l.   PSYCHIATRIC: The patient is alert and oriented x 3.  SKIN: No obvious rash, lesion, or ulcer.   DATA REVIEW:   CBC Recent Labs  Lab 12/16/17 1031  WBC 18.4*  HGB 9.1*  HCT 27.9*  PLT 495*    Chemistries  Recent Labs  Lab 12/15/17 0530  12/16/17 1031  NA 134* 134*  K 3.1* 3.2*  CL 102 100  CO2 25 23  GLUCOSE 97 115*  BUN 9 6*  CREATININE 0.61 0.64  CALCIUM 7.8* 8.2*  MG 1.5*  --   AST 46*  --   ALT 14  --   ALKPHOS 141*  --   BILITOT 1.3*  --     Cardiac Enzymes Recent Labs  Lab 12/14/17 1024  TROPONINI <0.03    Microbiology Results  Results for orders placed or performed during the hospital encounter of 12/14/17  Culture, blood (routine x 2)     Status: None (Preliminary result)   Collection Time: 12/14/17 11:53 AM  Result Value Ref Range Status   Specimen Description BLOOD RIGHT ANTECUBITAL  Final   Special Requests   Final    BOTTLES DRAWN AEROBIC AND ANAEROBIC Blood Culture results may not be optimal due to an excessive volume of blood received in culture bottles   Culture   Final    NO GROWTH 2 DAYS Performed at [facility], [address], [place], [place] [address]    Report Status PENDING  Incomplete  Culture, blood (  routine x 2)     Status: None (Preliminary result)   Collection Time: 12/14/17 11:58 AM  Result Value Ref Range Status   Specimen Description BLOOD BLOOD LEFT HAND  Final   Special Requests   Final    BOTTLES DRAWN AEROBIC AND ANAEROBIC Blood Culture adequate volume   Culture   Final    NO GROWTH 2 DAYS Performed at [facility], [address], [place], [place] [address]    Report Status PENDING  Incomplete  MRSA PCR Screening     Status: None   Collection Time: 12/15/17  1:39 PM  Result Value Ref Range Status   MRSA by PCR NEGATIVE NEGATIVE Final    Comment:        The GeneXpert MRSA Assay (FDA approved for NASAL specimens only), is one component of a comprehensive MRSA colonization surveillance program. It is not intended to diagnose MRSA infection nor to guide or monitor treatment for MRSA infections. Performed at [facility], [address], [place], [place] [address]     RADIOLOGY:  Ct Chest W Contrast  Result Date:  12/15/2017 CLINICAL DATA:  Inpatient. Left lung mass with bilateral pulmonary nodules on chest radiograph. Multiple liver masses on CT abdomen. EXAM: CT CHEST WITH CONTRAST TECHNIQUE: Multidetector CT imaging of the chest was performed during intravenous contrast administration. CONTRAST:  11m OMNIPAQUE IOHEXOL 300 MG/ML  SOLN COMPARISON:  12/14/2017 chest radiograph and CT abdomen/pelvis. FINDINGS: Cardiovascular: Normal heart size. Trace pericardial effusion/thickening. Three-vessel coronary atherosclerosis. Atherosclerotic nonaneurysmal thoracic aorta. Normal caliber pulmonary arteries. No central pulmonary emboli. Mediastinum/Nodes: No discrete thyroid nodules. Unremarkable esophagus. No pathologically enlarged axillary, mediastinal or hilar lymph nodes. Lungs/Pleura: No pneumothorax. Trace dependent right pleural effusion. No left pleural effusion. Mild centrilobular emphysema. Dominant 4.8 x 3.5 cm solid central left lower lobe lung mass (series 3/image 74). Numerous (greater than 20) solid pulmonary nodules of various sizes scattered throughout both lungs, largest 2.8 cm in the medial left upper lobe (series 3/image 87) and 2.4 cm in the right middle lobe (series 3/image 78). Upper abdomen: Partial visualization of numerous bulky hypoenhancing masses scattered throughout the liver, not appreciably changed from CT abdomen study performed 1 day prior. Right adrenal 2.6 cm nodule with density 57 HU, unchanged. Musculoskeletal:  No aggressive appearing focal osseous lesions. IMPRESSION: 1. Dominant 4.8 cm solid central left lower lobe lung mass, compatible with primary bronchogenic carcinoma. 2. Numerous (greater than 20) solid pulmonary nodules of various sizes scattered throughout both lungs, compatible with pulmonary metastases. 3. No thoracic adenopathy. 4. Trace dependent right pleural effusion. 5. Redemonstration of multiple bulky liver metastases. 6. Indeterminate right adrenal nodule suspicious for  adrenal metastasis. 7. Three-vessel coronary atherosclerosis. Aortic Atherosclerosis (ICD10-I70.0) and Emphysema (ICD10-J43.9). Electronically Signed   By: JIlona SorrelM.D.   On: 12/15/2017 11:58   [place]liver[place]  Result Date: 12/16/2017 INDICATION: 72year old with a dominant lung lesion and multiple lung nodules and liver lesions. Imaging findings are suggestive for metastatic disease. Tissue diagnosis is needed. EXAM: ULTRASOUND-GUIDED LIVER LESION BIOPSY MEDICATIONS: None. ANESTHESIA/SEDATION: Moderate (conscious) sedation was employed during this procedure. A total of Versed 1 mg and Fentanyl 50 mcg was administered intravenously. Moderate Sedation Time: 20 minutes. The patient's level of consciousness and vital signs were monitored continuously by radiology nursing throughout the procedure under my direct supervision. FLUOROSCOPY TIME:  None COMPLICATIONS: None immediate. PROCEDURE: Informed written consent was obtained from the patient after a thorough discussion of the procedural risks, benefits and alternatives. All  questions were addressed. A timeout was performed prior to the initiation of the procedure. Liver was evaluated with ultrasound. A hypoechoic lesion in the lateral left hepatic lobe was selected for biopsy because there was no surrounding ascites in this area. Small amount of ascites around the larger exophytic lesions in the right hepatic lobe. The abdomen with shaved. The anterior abdomen was prepped with chlorhexidine and sterile field was created. Maximal barrier sterile technique was utilized including caps, mask, sterile gowns, sterile gloves, sterile drape, hand hygiene and skin antiseptic. Skin and soft tissues anesthetized with 1% lidocaine. 11 gauge coaxial needle directed into the left hepatic lobe and left hepatic lesion with ultrasound guidance. Needle position confirmed within the lesion. A total of 6 core biopsies were performed with an 18 gauge device. Four adequate  specimens were obtained and placed in formalin. A Gel-Foam slurry was created and injected through the 17 gauge needle as the needle was withdrawn from the liver lesion. Bandage placed over the puncture site. FINDINGS: Several heterogeneous and hypoechoic liver lesions. Intrahepatic lesion in the lateral left hepatic lobe was selected for biopsy. Core biopsy needle was were confirmed within the lesion. Evidence for Gel-Foam within the lesion and along the needle track at the end of the procedure. No evidence for bleeding or hematoma formation. Small amount of ascites adjacent to the larger exophytic lesion along the anterior right hepatic lobe. IMPRESSION: Ultrasound-guided core biopsies of a left hepatic lesion. Electronically Signed   By: Markus Daft M.D.   On: 12/16/2017 11:34      Management plans discussed with the patient, family and they are in agreement.  CODE STATUS:     Code Status Orders  (From admission, onward)         Start     Ordered   12/14/17 1551  Full code  Continuous     12/14/17 1550        Code Status History    This patient has a current code status but no historical code status.      TOTAL TIME TAKING CARE OF THIS PATIENT: 40 minutes.    Henreitta Leber M.D on 12/16/2017 at 5:28 PM  Between 7am to 6pm - Pager - [phone]  After 6pm go to [email or web address] - [facility]  Office  [phone]  CC: Primary care physician; [facility]

## 2017-12-16 NOTE — Consult Note (Signed)
Chief Complaint: Patient was seen in consultation today for liver lesion biopsy   Referring Physician(s): Katharina Caper   Patient Status: [place] - In-pt  History of Present Illness: Shane Russo is a 72 y.o. male with a dominant left lung mass and multiple lung and liver lesions.  Patient needs a tissue diagnosis.  Patient has no complaints today but he seems a little confused and does not fully understand his current situation.  He is accompanied by his son.     Past Medical History:  Diagnosis Date  . Hypertension     Past Surgical History:  Procedure Laterality Date  . none      Allergies: Patient has no known allergies.  Medications: Prior to Admission medications   Medication Sig Start Date End Date Taking? Authorizing Provider  levothyroxine (SYNTHROID, LEVOTHROID) 25 MCG tablet Take 25 mcg by mouth daily. 11/20/17  Yes [provider]  multivitamin (ONE-A-DAY MEN'S) TABS tablet Take 1 tablet by mouth daily.   Yes [provider]     History reviewed. No pertinent family history.  Social History   Socioeconomic History  . Marital status: Widowed    Spouse name: Not on file  . Number of children: Not on file  . Years of education: Not on file  . Highest education level: Not on file  Occupational History  . Occupation: retired  Scientific laboratory technician  . Financial resource strain: Not on file  . Food insecurity:    Worry: Not on file    Inability: Not on file  . Transportation needs:    Medical: Not on file    Non-medical: Not on file  Tobacco Use  . Smoking status: Current Every Day Smoker  . Smokeless tobacco: Never Used  Substance and Sexual Activity  . Alcohol use: Yes    Frequency: Never  . Drug use: Never  . Sexual activity: Not Currently  Lifestyle  . Physical activity:    Days per week: Not on file    Minutes per session: Not on file  . Stress: Not on file  Relationships  . Social connections:    Talks on phone: Not on file    Gets together: Not on file    Attends religious service: Not on file    Active member of club or organization: Not on file    Attends meetings of clubs or organizations: Not on file    Relationship status: Not on file  Other Topics Concern  . Not on file  Social History Narrative  . Not on file     Review of Systems  Constitutional: Positive for unexpected weight change.  Respiratory: Negative.   Cardiovascular: Negative.     Vital Signs: BP 117/82   Pulse (!) 104   Temp 98.9 F (37.2 C) (Oral)   Resp 18   Ht 6\' 4"  (1.93 m)   Wt 66.4 kg   SpO2 98%   BMI 17.82 kg/m   Physical Exam  Constitutional: He is oriented to person, place, and time. No distress.  Cardiovascular: Normal rate, regular rhythm and normal heart sounds.  Pulmonary/Chest: Effort normal and breath sounds normal.  Abdominal: Soft. Bowel sounds are normal. He exhibits no distension. There is no tenderness.  Neurological: He is alert and oriented to person, place, and time.  Mild confusion.    Imaging: Dg Chest 2 View  Result Date: 12/14/2017 CLINICAL DATA:  Weakness. Leukocytosis. Anemia. EXAM: CHEST - 2 VIEW COMPARISON:  None. FINDINGS: Normal sized heart.  Tortuous aorta. 4.5 cm mass in the superior segment of the left lower lobe. Multiple additional smaller bilateral lung nodules. Unremarkable bones. IMPRESSION: 4.5 cm mass in the superior segment of the left lower lobe with multiple additional smaller bilateral lung nodules. This is concerning for metastases with a possible primary lung carcinoma. An infectious process is less likely. Further evaluation with a chest CT with contrast is recommended. Electronically Signed   By: Claudie Revering M.D.   On: 12/14/2017 13:10   Ct Chest W Contrast  Result Date: 12/15/2017 CLINICAL DATA:  Inpatient. Left lung mass with bilateral pulmonary nodules on chest radiograph. Multiple liver masses on CT abdomen. EXAM: CT CHEST WITH CONTRAST TECHNIQUE: Multidetector CT  imaging of the chest was performed during intravenous contrast administration. CONTRAST:  55mL OMNIPAQUE IOHEXOL 300 MG/ML  SOLN COMPARISON:  12/14/2017 chest radiograph and CT abdomen/pelvis. FINDINGS: Cardiovascular: Normal heart size. Trace pericardial effusion/thickening. Three-vessel coronary atherosclerosis. Atherosclerotic nonaneurysmal thoracic aorta. Normal caliber pulmonary arteries. No central pulmonary emboli. Mediastinum/Nodes: No discrete thyroid nodules. Unremarkable esophagus. No pathologically enlarged axillary, mediastinal or hilar lymph nodes. Lungs/Pleura: No pneumothorax. Trace dependent right pleural effusion. No left pleural effusion. Mild centrilobular emphysema. Dominant 4.8 x 3.5 cm solid central left lower lobe lung mass (series 3/image 74). Numerous (greater than 20) solid pulmonary nodules of various sizes scattered throughout both lungs, largest 2.8 cm in the medial left upper lobe (series 3/image 87) and 2.4 cm in the right middle lobe (series 3/image 78). Upper abdomen: Partial visualization of numerous bulky hypoenhancing masses scattered throughout the liver, not appreciably changed from CT abdomen study performed 1 day prior. Right adrenal 2.6 cm nodule with density 57 HU, unchanged. Musculoskeletal:  No aggressive appearing focal osseous lesions. IMPRESSION: 1. Dominant 4.8 cm solid central left lower lobe lung mass, compatible with primary bronchogenic carcinoma. 2. Numerous (greater than 20) solid pulmonary nodules of various sizes scattered throughout both lungs, compatible with pulmonary metastases. 3. No thoracic adenopathy. 4. Trace dependent right pleural effusion. 5. Redemonstration of multiple bulky liver metastases. 6. Indeterminate right adrenal nodule suspicious for adrenal metastasis. 7. Three-vessel coronary atherosclerosis. Aortic Atherosclerosis (ICD10-I70.0) and Emphysema (ICD10-J43.9). Electronically Signed   By: Ilona Sorrel M.D.   On: 12/15/2017 11:58   Ct  Abdomen Pelvis W Contrast  Result Date: 12/14/2017 CLINICAL DATA:  Low hemoglobin, elevated white blood cell count, weakness and severe weight loss EXAM: CT ABDOMEN AND PELVIS WITH CONTRAST TECHNIQUE: Multidetector CT imaging of the abdomen and pelvis was performed using the standard protocol following bolus administration of intravenous contrast. CONTRAST:  144mL ISOVUE-300 IOPAMIDOL (ISOVUE-300) INJECTION 61% COMPARISON:  None. FINDINGS: Lower chest: Multiple lung nodules are present at the lung bases left-greater-than-right most consistent with metastatic involvement of the lungs. No pleural effusion is seen. The heart is within normal limits in size. No pericardial effusion is evident. Hepatobiliary: There are multiple lesions throughout both the right and left lobes of liver of varying sizes, consistent with diffuse metastatic involvement of the liver. One of the largest lesions involves the medial segment of the left lobe measuring approximately 9.4 x 9.8 cm. No intrahepatic ductal dilatation is seen. Gallbladder is not visualized. Pancreas: The pancreas appears atrophic. The pancreatic duct is not dilated. Spleen: The spleen is unremarkable. Adrenals/Urinary Tract: The adrenal glands appear normal. The kidneys enhance with no focal abnormality and no calculi are evident. On delayed images, the pelvocaliceal systems appear normal. No hydronephrosis is seen. The ureters appear normal in caliber. The urinary bladder is decompressed  and cannot be evaluated. Stomach/Bowel: The stomach is very distended with oral contrast and air. No definite abnormality of the small bowel is seen. There is a moderate amount of feces particularly in the rectosigmoid region. No edema of the rectal mucosa is evident. The hepatic flexure of colon is deviated medially by the large hepatic lesion. The terminal ileum and the appendix appear unremarkable. Vascular/Lymphatic: The abdominal aorta is normal in caliber with moderate  abdominal aortic atherosclerosis. No adenopathy is seen. Reproductive: The prostate is unremarkable. Other: No abdominal wall hernia is seen. There is a small amount of ascites present around the liver dependently layering within the pelvis. Musculoskeletal: There is degenerative disc disease at L3-4 with considerable loss of disc space and slight anterolisthesis of L3 on L4. No lytic or blastic lesion is evident. IMPRESSION: 1. Multiple liver lesions throughout both the right and left lobes consistent with liver metastasis. Multicentric hepatoma cannot be excluded. 2. Multiple lung nodules at the lung bases left-greater-than-right are consistent with metastatic involvement of the lungs. Chest x-ray or CT of the chest would be helpful to assess for possible primary lesion. 3. Small amount of ascites. Electronically Signed   By: Ivar Drape M.D.   On: 12/14/2017 13:47    Labs:  CBC: Recent Labs    12/14/17 1024 12/15/17 0530  WBC 18.2* 15.3*  HGB 9.1* 6.9*  HCT 29.2* 21.4*  PLT 574* 446*    COAGS: Recent Labs    12/15/17 0530  INR 1.16    BMP: Recent Labs    12/14/17 1024 12/15/17 0530  NA 137 134*  K 3.0* 3.1*  CL 97* 102  CO2 24 25  GLUCOSE 137* 97  BUN 10 9  CALCIUM 8.9 7.8*  CREATININE 0.70 0.61  GFRNONAA >60 >60  GFRAA >60 >60    LIVER FUNCTION TESTS: Recent Labs    12/15/17 0530  BILITOT 1.3*  AST 46*  ALT 14  ALKPHOS 141*  PROT 6.1*  ALBUMIN 2.4*    TUMOR MARKERS: No results for input(s): AFPTM, CEA, CA199, CHROMGRNA in the last 8760 hours.  Assessment and Plan:  72 yo with evidence of metastatic disease and suspect a lung primary.  Patient needs a tissue diagnosis.  Reviewed recent CT imaging and multiple liver lesions are present and amendable to US guided biopsy.  Discussed US guided liver lesion biopsy with patient and son.  Risk include but no limited to bleeding.  Informed consent obtained from son due to patient confusion.  Plan for US guided  liver lesion biopsy with moderate sedation.    Thank you for this interesting consult.  I greatly enjoyed meeting Shane Russo and look forward to participating in their care.  A copy of this report was sent to the requesting provider on this date.  Electronically Signed: Burman Riis, MD 12/16/2017, 8:54 AM   I spent a total of 20 Minutes    in face to face in clinical consultation, greater than 50% of which was counseling/coordinating care for US guided liver lesion biopsy.

## 2017-12-16 NOTE — Procedures (Signed)
  Pre-operative Diagnosis: Metastatic disease       Post-operative Diagnosis: Metastatic disease   Indications: Needs tissue diagnosis  Procedure: US guided liver lesion biopsy  Findings: Several liver lesions.  Left hepatic lesion was biopsied.  Total of 6 cores performed and 4 adequate specimens obtained. Gelfoam slurry injected through coaxial needle during needle withdrawal.    Complications: None     EBL: None  Plan: Bedrest 3 hours.

## 2017-12-18 ENCOUNTER — Other Ambulatory Visit: Payer: Self-pay | Admitting: Hematology and Oncology

## 2017-12-18 LAB — SURGICAL PATHOLOGY

## 2017-12-19 LAB — CULTURE, BLOOD (ROUTINE X 2)
Culture: NO GROWTH
Culture: NO GROWTH
Special Requests: ADEQUATE

## 2017-12-20 DIAGNOSIS — D649 Anemia, unspecified: Secondary | ICD-10-CM | POA: Insufficient documentation

## 2017-12-20 DIAGNOSIS — C799 Secondary malignant neoplasm of unspecified site: Secondary | ICD-10-CM | POA: Insufficient documentation

## 2017-12-20 NOTE — Progress Notes (Signed)
[facility] day:  12/21/2017  Chief Complaint: Shane Paulsen. is a 72 y.o. male with metastatic adenocarcinoma who is seen for assessment after recent hospitalization and discussion regarding direction of therapy.  HPI: The patient notes a 20-30 pack year smoking history.  He has lost approximately 22 pounds over the past year.  His appetite is poor.  He denies any nausea, vomiting or diarrhea.  Over the past 2 weeks, he has been seen at the [facility] at his son's insistence.  He presented with weight loss.  Initial labs revealed a "high white blood cell count and anemia" worrisome for infection.  He had a cough.  He denied any fever.  He was treated with antibiotics.  He presented to [facility] ER.  CBC revealed a hematocrit of 29.2, hemoglobin 9.1, MCV 77.6, platelets 574,000, and WBC 18,200.  Creatinine was 0.70.  Lactic acid was 3.7 (elevated).  CXR revealed a 4.5 cm mass in the superior segment of the left lower lobe and multiple smaller bilateral lung nodules.  Abdomen and pelvic CT on 12/14/2017 revealed multiple liver lesions throughout both the right and left lobes consistent with liver metastasis. Largest lesion was 9.4 x 9.8 cm.  Multicentric hepatoma cannot be excluded.  There were multiple lung nodules at the lung bases left-greater-than-right c/w metastatic involvement of the lungs.   There was a small amount of ascites.  He was admitted to [facility] from 12/14/2017 - 12/16/2017.  He was empirically started on vancomycin and Cefepime.  Blood cultures were negative.  Hemoglobin decreased to 6.9.  He was transfused with 1 unit of PRBCs.  Anemia work-up included: B12 (553) and folate (6.4).  Ferritin was 1326.  Iron saturation was 17% with a TIBC of 134.  Retic was 2.6%  Chest CT on 12/15/2017 revealed a 4.8 cm solid central left lower lobe lung mass c/w primary bronchogenic carcinoma.  There were > 20 solid pulmonary nodules of various  sizes scattered throughout both lungs.  There was no thoracic adenopathy.  There was a trace dependent right pleural effusion.  There was an indeterminate right adrenal nodule suspicious for adrenal metastasis.  CEA was 444.  AFP was 9.0 (0-8.3) on 12/15/2017.  Ultrasound guided liver biopsy on 12/16/2017 revealed adenocarcinoma with extracellular mucin and extensive necrosis.  Cytokeratin 7 was positive.  Cytokeratin 20, TTF-1, napsin, and CDX-2 was negative.  The differential diagnosis included primary hepatic cholangiocarcinoma; adenocarcinoma of the pancreas, gallbladder or upper GI (stomach, distal esophagus). There was insufficient tissue for ancillary molecular testing.  Symptomatically, patient notes that he is doing "real good". He denies any acute symptoms today. No nausea, vomiting, or significant changes to his bowel habits. Patient denies abdominal pain. He notes no post-procedural issues following his liver biopsy. He denies increased shortness of breath. Patient denies that he has experienced any B symptoms. He denies any interval infections.   Patient advises that he maintains an adequate appetite. He is eating well. Weight today is 151 lb (68.5 kg). Son is with patient in clinic today and notes that patient was "65 kg" at this time of discharge. If this is indeed the case, Shane Bebo. has gained approximately 8 pounds.   Patient denies pain in the clinic today.   Past Medical History:  Diagnosis Date  . Anemia   . Hypertension   . Thyroid disease     Past Surgical History:  Procedure Laterality Date  . none  History reviewed. No pertinent family history.  Social History:  reports that he has been smoking cigarettes. He has a 25.00 pack-year smoking history. His smokeless tobacco use includes chew. He reports that he drinks alcohol. He reports that he does not use drugs.  He currently smokes 1/3 pack/day.  He started smoking as a child.  He previously drank a 5th  every day.  He currently drinks a pint a week.  He denies any exposure to radiation or toxins.  He previously was a Museum/gallery curator.  He lives in a house with 3 other people (1 family member and 2 friends).  He is accompanied by his son, Broadus Scout, and daughter-in-law, [name].  Allergies: No Known Allergies  Current Medications: Current Outpatient Medications  Medication Sig Dispense Refill  . levothyroxine (SYNTHROID, LEVOTHROID) 25 MCG tablet Take 25 mcg by mouth daily.  1  . multivitamin (ONE-A-DAY MEN'S) TABS tablet Take 1 tablet by mouth daily.     No current facility-administered medications for this visit.     Review of Systems  Constitutional: Negative for diaphoresis, fever, malaise/fatigue and weight loss (?? 8 pound increase since discharge - per son's report).       "I'm doing real good".   HENT: Negative.   Eyes: Negative.   Respiratory: Negative for cough, hemoptysis, sputum production and shortness of breath.   Cardiovascular: Negative for chest pain, palpitations, orthopnea, leg swelling and PND.  Gastrointestinal: Negative for abdominal pain, blood in stool, constipation, diarrhea, melena, nausea and vomiting.  Genitourinary: Negative for dysuria, frequency, hematuria and urgency.  Musculoskeletal: Negative for back pain, falls, joint pain and myalgias.  Skin: Negative for itching and rash.  Neurological: Negative for dizziness, tremors, weakness and headaches.  Endo/Heme/Allergies: Does not bruise/bleed easily.  Psychiatric/Behavioral: Negative for depression, memory loss and suicidal ideas. The patient is not nervous/anxious and does not have insomnia.        HYPERsomnia  All other systems reviewed and are negative.  Performance status (ECOG): 1 - 2  Vital Signs BP 137/74 (BP Location: Left Arm, Patient Position: Sitting)   Pulse 98   Temp (!) 94.3 F (34.6 C) (Tympanic)   Resp 18   Ht 6' 4.38" (1.94 m)   Wt 151 lb (68.5 kg)   BMI 18.20 kg/m   Physical Exam   Constitutional: He is oriented to person, place, and time. Sickly appearance: chronically ill.  Tall and very thin gentleman. (+) temporal wasting.  HENT:  Head: Normocephalic and atraumatic.  Mouth/Throat: Oropharynx is clear and moist and mucous membranes are normal. Abnormal dentition (poor).  Near alopecia  Eyes: Pupils are equal, round, and reactive to light. EOM are normal. No scleral icterus.  Brown eyes.  Neck: Normal range of motion. Neck supple. No tracheal deviation present. No thyromegaly present.  Cardiovascular: Normal rate, regular rhythm, normal heart sounds and intact distal pulses. Exam reveals no gallop and no friction rub.  No murmur heard. Pulmonary/Chest: Effort normal and breath sounds normal. No respiratory distress. He has no wheezes. He has no rales.  Abdominal: Soft. Bowel sounds are normal. He exhibits no distension. There is hepatomegaly. There is no tenderness.  Musculoskeletal: Normal range of motion. He exhibits no edema or tenderness.  Lymphadenopathy:    He has no cervical adenopathy.    He has no axillary adenopathy.       Right: No inguinal and no supraclavicular adenopathy present.       Left: No inguinal and no supraclavicular adenopathy present.  Neurological:  He is alert and oriented to person, place, and time.  Skin: Skin is warm and dry. No rash noted. No erythema. Nails show clubbing.  Psychiatric: Mood, affect and judgment normal.  Nursing note and vitals reviewed.   No visits with results within 3 Day(s) from this visit.  Latest known visit with results is:  Admission on 12/14/2017, Discharged on 12/16/2017  Component Date Value Ref Range Status  . Sodium 12/14/2017 137  135 - 145 mmol/L Final  . Potassium 12/14/2017 3.0* 3.5 - 5.1 mmol/L Final  . Chloride 12/14/2017 97* 98 - 111 mmol/L Final  . CO2 12/14/2017 24  22 - 32 mmol/L Final  . Glucose, Bld 12/14/2017 137* 70 - 99 mg/dL Final  . BUN 12/14/2017 10  8 - 23 mg/dL Final  .  Creatinine, Ser 12/14/2017 0.70  0.61 - 1.24 mg/dL Final  . Calcium 12/14/2017 8.9  8.9 - 10.3 mg/dL Final  . GFR calc non Af Amer 12/14/2017 >60  >60 mL/min Final  . GFR calc Af Amer 12/14/2017 >60  >60 mL/min Final   Comment: (NOTE) The eGFR has been calculated using the CKD EPI equation. This calculation has not been validated in all clinical situations. eGFR's persistently <60 mL/min signify possible Chronic Kidney Disease.   Georgiann Hahn gap 12/14/2017 16* 5 - 15 Final   Performed at [facility], [address] [place], [place] [address]  . WBC 12/14/2017 18.2* 3.8 - 10.6 K/uL Final  . RBC 12/14/2017 3.77* 4.40 - 5.90 MIL/uL Final  . Hemoglobin 12/14/2017 9.1* 13.0 - 18.0 g/dL Final  . HCT 12/14/2017 29.2* 40.0 - 52.0 % Final  . MCV 12/14/2017 77.6* 80.0 - 100.0 fL Final  . MCH 12/14/2017 24.2* 26.0 - 34.0 pg Final  . MCHC 12/14/2017 31.2* 32.0 - 36.0 g/dL Final  . RDW 12/14/2017 15.8* 11.5 - 14.5 % Final  . Platelets 12/14/2017 574* 150 - 440 K/uL Final   Performed at [facility], [address], [place], [place] [address]  . Color, Urine 12/15/2017 AMBER* YELLOW Final   BIOCHEMICALS MAY BE AFFECTED BY COLOR  . APPearance 12/15/2017 CLEAR* CLEAR Final  . Specific Gravity, Urine 12/15/2017 >1.046* 1.005 - 1.030 Final  . pH 12/15/2017 5.0  5.0 - 8.0 Final  . Glucose, UA 12/15/2017 NEGATIVE  NEGATIVE mg/dL Final  . Hgb urine dipstick 12/15/2017 NEGATIVE  NEGATIVE Final  . Bilirubin Urine 12/15/2017 NEGATIVE  NEGATIVE Final  . Ketones, ur 12/15/2017 NEGATIVE  NEGATIVE mg/dL Final  . Protein, ur 12/15/2017 NEGATIVE  NEGATIVE mg/dL Final  . Nitrite 12/15/2017 NEGATIVE  NEGATIVE Final  . Leukocytes, UA 12/15/2017 NEGATIVE  NEGATIVE Final  . RBC / HPF 12/15/2017 0-5  0 - 5 RBC/hpf Final  . WBC, UA 12/15/2017 0-5  0 - 5 WBC/hpf Final  . Bacteria, UA 12/15/2017 NONE SEEN  NONE SEEN Final  . Squamous Epithelial / LPF 12/15/2017 0-5  0 - 5 Final  . Mucus 12/15/2017  PRESENT   Final   Performed at [facility], [address], [place], [place] [address]  . ABO/RH(D) 12/14/2017 A POS   Final  . Antibody Screen 12/14/2017 NEG   Final  . Sample Expiration 12/14/2017 12/17/2017   Final  . Unit Number 12/14/2017 [id number]   Final  . Blood Component Type 12/14/2017 RBC LR PHER1   Final  . Unit division 12/14/2017 00   Final  . Status of Unit 12/14/2017 ISSUED,FINAL   Final  . Transfusion Status 12/14/2017  OK TO TRANSFUSE   Final  . Crossmatch Result 12/14/2017    Final                   Value:Compatible Performed at [facility] [address] [place], [place] [address]   . Specimen Description 12/14/2017 BLOOD RIGHT ANTECUBITAL   Final  . Special Requests 12/14/2017 BOTTLES DRAWN AEROBIC AND ANAEROBIC Blood Culture results may not be optimal due to an excessive volume of blood received in culture bottles   Final  . Culture 12/14/2017    Final                   Value:NO GROWTH 5 DAYS Performed at [facility], [address] [place], [place] [address]   . Report Status 12/14/2017 12/19/2017 FINAL   Final  . Specimen Description 12/14/2017 BLOOD BLOOD LEFT HAND   Final  . Special Requests 12/14/2017 BOTTLES DRAWN AEROBIC AND ANAEROBIC Blood Culture adequate volume   Final  . Culture 12/14/2017    Final                   Value:NO GROWTH 5 DAYS Performed at [facility], [address] [place], [place] [address]   . Report Status 12/14/2017 12/19/2017 FINAL   Final  . Lactic Acid, Venous 12/14/2017 3.7* 0.5 - 1.9 mmol/L Final   Comment: CRITICAL RESULT CALLED TO, READ BACK BY AND VERIFIED WITH [place] WALKER AT 6226 12/14/17 DAS Performed at [facility], [address] [place], [place] [address]   . Troponin I 12/14/2017 <0.03  <0.03 ng/mL Final   Performed at [facility], [address] [place], [place] [address]  . Lactic Acid, Venous 12/14/2017 2.9* 0.5 - 1.9 mmol/L Final    Comment: CRITICAL RESULT CALLED TO, READ BACK BY AND VERIFIED WITH BRANDY MILLER AT 1840 12/14/2017.  TFK Performed at [facility], [address] [place], [place] [address]   . Ferritin 12/15/2017 1,326* 24 - 336 ng/mL Final   Performed at [facility], [address] [place], [place] [address]  . Iron 12/15/2017 23* 45 - 182 ug/dL Final  . TIBC 12/15/2017 134* 250 - 450 ug/dL Final  . Saturation Ratios 12/15/2017 17* 17.9 - 39.5 % Final  . UIBC 12/15/2017 111  ug/dL Final   Performed at [facility], [address] [place], [place] [address]  . Vitamin B-12 12/15/2017 553  180 - 914 pg/mL Final   Comment: (NOTE) This assay is not validated for testing neonatal or myeloproliferative syndrome specimens for Vitamin B12 levels. Performed at [facility], [place] [address] [place], [place] [address]   . Folate 12/15/2017 6.4  >5.9 ng/mL Final   Performed at [facility], [address] [place], [place] [address]  . WBC 12/15/2017 15.3* 3.8 - 10.6 K/uL Final  . RBC 12/15/2017 2.77* 4.40 - 5.90 MIL/uL Final  . Hemoglobin 12/15/2017 6.9* 13.0 - 18.0 g/dL Final   RESULT REPEATED AND VERIFIED  . HCT 12/15/2017 21.4* 40.0 - 52.0 % Final  . MCV 12/15/2017 77.3* 80.0 - 100.0 fL Final  . MCH 12/15/2017 25.0* 26.0 - 34.0 pg Final  . MCHC 12/15/2017 32.4  32.0 - 36.0 g/dL Final  . RDW 12/15/2017 15.7* 11.5 - 14.5 % Final  . Platelets 12/15/2017 446* 150 - 440 K/uL Final  . Neutrophils Relative % 12/15/2017 82  % Final  . Neutro Abs 12/15/2017 12.6* 1.4 - 6.5  K/uL Final  . Lymphocytes Relative 12/15/2017 5  % Final  . Lymphs Abs 12/15/2017 0.8* 1.0 - 3.6 K/uL Final  . Monocytes Relative 12/15/2017 11  % Final  . Monocytes Absolute 12/15/2017 1.7* 0.2 - 1.0 K/uL Final  . Eosinophils Relative 12/15/2017 1  % Final  . Eosinophils Absolute 12/15/2017 0.1  0 - 0.7 K/uL Final  . Basophils Relative 12/15/2017 1  % Final  . Basophils Absolute  12/15/2017 0.1  0 - 0.1 K/uL Final   Performed at [facility], [address], [place], [place] [address]  . CEA 12/15/2017 444.0* 0.0 - 4.7 ng/mL Final   Comment: (NOTE)                             Nonsmokers          <3.9                             Smokers             <5.6 Roche [employer]ostics Electrochemiluminescence Immunoassay (ECLIA) Values obtained with different assay methods or kits cannot be used interchangeably.  Results cannot be interpreted as absolute evidence of the presence or absence of malignant disease. Performed At: [facility] [place], [place] [id number] Rush Farmer MD EM:[id number]   . AFP, Serum, Tumor Marker 12/15/2017 9.0* 0.0 - 8.3 ng/mL Final   Comment: (NOTE) Roche [employer]ostics Electrochemiluminescence Immunoassay (ECLIA) Values obtained with different assay methods or kits cannot be used interchangeably.  Results cannot be interpreted as absolute evidence of the presence or absence of malignant disease. This test is not interpretable in pregnant females. Performed At: [facility] [place], [place] [id number] Rush Farmer MD TG:[id number]   . Prothrombin Time 12/15/2017 14.7  11.4 - 15.2 seconds Final  . INR 12/15/2017 1.16   Final   Performed at [facility], [address] [place], [place] [address]  . Sodium 12/15/2017 134* 135 - 145 mmol/L Final  . Potassium 12/15/2017 3.1* 3.5 - 5.1 mmol/L Final  . Chloride 12/15/2017 102  98 - 111 mmol/L Final  . CO2 12/15/2017 25  22 - 32 mmol/L Final  . Glucose, Bld 12/15/2017 97  70 - 99 mg/dL Final  . BUN 12/15/2017 9  8 - 23 mg/dL Final  . Creatinine, Ser 12/15/2017 0.61  0.61 - 1.24 mg/dL Final  . Calcium 12/15/2017 7.8* 8.9 - 10.3 mg/dL Final  . Total Protein 12/15/2017 6.1* 6.5 - 8.1 g/dL Final  . Albumin 12/15/2017 2.4* 3.5 - 5.0 g/dL Final  . AST 12/15/2017 46* 15 - 41 U/L Final  . ALT 12/15/2017 14  0 - 44 U/L Final  . Alkaline  Phosphatase 12/15/2017 141* 38 - 126 U/L Final  . Total Bilirubin 12/15/2017 1.3* 0.3 - 1.2 mg/dL Final  . GFR calc non Af Amer 12/15/2017 >60  >60 mL/min Final  . GFR calc Af Amer 12/15/2017 >60  >60 mL/min Final   Comment: (NOTE) The eGFR has been calculated using the CKD EPI equation. This calculation has not been validated in all clinical situations. eGFR's persistently <60 mL/min signify possible Chronic Kidney Disease.   Georgiann Hahn gap 12/15/2017 7  5 - 15 Final   Performed at [facility], [address] [place],  [address]  . Order Confirmation 12/15/2017    Final  Value:ORDER PROCESSED BY BLOOD BANK Performed at [facility], [address] [place], [place] [address]   . ABO/RH(D) 12/15/2017    Final                   Value:A POS Performed at [facility], [address] [place], [place] [address]   . ISSUE DATE / TIME 12/14/2017 [id number]   Final  . Blood Product Unit Number 12/14/2017 [id number]   Final  . PRODUCT CODE 12/14/2017 [id number]   Final  . Unit Type and Rh 12/14/2017 6200   Final  . Blood Product Expiration Date 12/14/2017 [id number]   Final  . MRSA by PCR 12/15/2017 NEGATIVE  NEGATIVE Final   Comment:        The GeneXpert MRSA Assay (FDA approved for NASAL specimens only), is one component of a comprehensive MRSA colonization surveillance program. It is not intended to diagnose MRSA infection nor to guide or monitor treatment for MRSA infections. Performed at [facility], [address] [place], [place] [address]   . Magnesium 12/15/2017 1.5* 1.7 - 2.4 mg/dL Final   Performed at [facility], [address] [place], [place] [address]  . Retic Ct Pct 12/15/2017 2.6  0.4 - 3.1 % Final  . RBC. 12/15/2017 2.67* 4.22 - 5.81 MIL/uL Final  . Retic Count, Absolute 12/15/2017 68.6  19.0 - 186.0 K/uL Final  . Immature Retic Fract 12/15/2017 32.5* 2.3 - 15.9 % Final   Performed  at [facility], [address], [place], [place] [address]  . SURGICAL PATHOLOGY 12/16/2017    Final                   Value:Surgical Pathology CASE: [id number] PATIENT: Shane Russo Surgical Pathology Report  SPECIMEN SUBMITTED: A. Liver; biopsy  CLINICAL HISTORY: Dominant 4.8 cm lung mass with numerous bilateral lung nodules and several liver lesions.  Current every day smoker.  PRE-OPERATIVE DIAGNOSIS: Metastatic disease, suspect a lung primary  POST-OPERATIVE DIAGNOSIS: Same as above  DIAGNOSIS: A. LIVER, ULTRASOUND-GUIDED BIOPSY: - ADENOCARCINOMA WITH EXTRACELLULAR MUCIN AND EXTENSIVE NECROSIS.  There is insufficient material for ancillary molecular testing.  Comment: A limited panel of immunohistochemical stains was performed with the following pattern of immunoreactivity: Cytokeratin 7: Positive Cytokeratin 20: Negative TTF-1: Negative Napsin: Negative CDX 2: Negative The differential diagnosis includes primary hepatic cholangiocarcinoma; adenocarcinoma of the pancreas, gallbladder or upper GI (stomach, distal esophagus). These findings were communic                         ated to Dr. Mike Gip on 12/18/2017 via [facility] secure text.  IHC slides were prepared by [facility], [facility], . All controls stained appropriately.  This test was developed and its performance characteristics determined by [facility]. It has not been cleared or approved by the [place] Food and Drug Administration. The FDA does not require this test to go through premarket FDA review. This test is used for clinical purposes. It should not be regarded as investigational or for research. This laboratory is certified under the Clinical Laboratory Improvement Amendments (CLIA) as qualified to perform high complexity clinical laboratory testing.  GROSS DESCRIPTION: A. Labeled: Liver mass biopsy Received: In formalin Tissue fragment(s): Multiple cores  and fragments Size: Aggregate, 4.1 x 0.1 x 0.1 cm Description: Pink-tan cores and fragments Entirely submitted in two cassettes.  Final Diagnosis performed by Quay Burow, MD.   Electronical  ly signed 12/18/2017 3:44:59PM The electronic signature indicates that the named Attending Pathologist has evaluated the specimen  Technical component performed at [facility], [address] [place], [place] [address] Lab: [id number] Dir: Rush Farmer, MD, MMM  Professional component performed at [facility], [facility], [place], [place], [place] [address] Lab: [phone] Dir: Dellia Nims. Rubinas, MD   . WBC 12/16/2017 18.4* 4.0 - 10.5 K/uL Final  . RBC 12/16/2017 3.60* 4.22 - 5.81 MIL/uL Final  . Hemoglobin 12/16/2017 9.1* 13.0 - 17.0 g/dL Final  . HCT 12/16/2017 27.9* 39.0 - 52.0 % Final  . MCV 12/16/2017 77.5* 80.0 - 100.0 fL Final  . MCH 12/16/2017 25.3* 26.0 - 34.0 pg Final  . MCHC 12/16/2017 32.6  30.0 - 36.0 g/dL Final  . RDW 12/16/2017 15.2  11.5 - 15.5 % Final  . Platelets 12/16/2017 495* 150 - 400 K/uL Final  . nRBC 12/16/2017 0.0  0.0 - 0.2 % Final   Performed at [facility], [address] [place], [place] [address]  . Sodium 12/16/2017 134* 135 - 145 mmol/L Final  . Potassium 12/16/2017 3.2* 3.5 - 5.1 mmol/L Final  . Chloride 12/16/2017 100  98 - 111 mmol/L Final  . CO2 12/16/2017 23  22 - 32 mmol/L Final  . Glucose, Bld 12/16/2017 115* 70 - 99 mg/dL Final  . BUN 12/16/2017 6* 8 - 23 mg/dL Final  . Creatinine, Ser 12/16/2017 0.64  0.61 - 1.24 mg/dL Final  . Calcium 12/16/2017 8.2* 8.9 - 10.3 mg/dL Final  . GFR calc non Af Amer 12/16/2017 >60  >60 mL/min Final  . GFR calc Af Amer 12/16/2017 >60  >60 mL/min Final   Comment: (NOTE) The eGFR has been calculated using the CKD EPI equation. This calculation has not been validated in all clinical situations. eGFR's persistently <60 mL/min signify possible Chronic  Kidney Disease.   Georgiann Hahn gap 12/16/2017 11  5 - 15 Final   Performed at [facility], [address] [place], [place] [address]    Assessment:  Shane Lindblad. is a 72 y.o. male with metastatic adenocarcinoma s/p liver biopsy on 12/16/2017.  The majority of the sample was necrotic.  Pathology revealed adenocarcinoma with extracellular mucin and extensive necrosis.  Cytokeratin 7 was positive.  Cytokeratin 20, TTF-1, napsin, and CDX-2 was negative.  Differential diagnosis included primary hepatic cholangiocarcinoma, adenocarcinoma of the pancreas, gallbladder or upper GI (stomach, distal esophagus). There was insufficient tissue for ancillary molecular testing.  CEA was 444 and AFP was 9.0 (0-8.3) on 12/15/2017.  Abdomen and pelvic CT on 12/14/2017 revealed multiple liver lesions throughout both the right and left lobes consistent with liver metastasis. Largest lesion was 9.4 x 9.8 cm.  Multicentric hepatoma cannot be excluded.  There were multiple lung nodules at the lung bases left-greater-than-right c/w metastatic involvement of the lungs.   There was a small amount of ascites.  Chest CT on 12/15/2017 revealed a 4.8 cm solid central left lower lobe lung mass c/w primary bronchogenic carcinoma.  There were > 20 solid pulmonary nodules of various sizes scattered throughout both lungs.  There was no thoracic adenopathy.  There was a trace dependent right pleural effusion.  There was an indeterminate right adrenal nodule suspicious for adrenal metastasis.  He has a microcytic anemia.  Anemia work-up on 03/16/2017 included: B12 (553) and folate (6.4).  Ferritin was 1326.  Iron saturation was 17% with a TIBC of 134.  Retic was 2.6%  Symptomatically, he denies  any acute concerns. He is virtually asymptomatic.  He denies any shortness of breath, pain, or nausea reported. Exam reveals a palpable liver in a thin elderly gentleman.   Plan: 1.   Metastatic adenocarcinoma:  Discuss initial  concern for metastatic lung cancer.  Discuss limited tissue for testing secondary to extensive necrosis.  Discuss differential diagnosis:  primary hepatic cholangiocarcinoma; adenocarcinoma of the pancreas, gallbladder or upper GI (stomach, distal esophagus).  Discuss consideration of obtaining a second biopsy. Need further imaging (PET scan) to determine a potential alternative biopsy site.   Discuss upper endoscopy. Will refer to Dr. Allen Norris for urgent consult and consideration of EGD with biopsy.  2.  Microcytic anemia:  Work-up revealed an elevated ferritin (? marker of inflammation) and normal iron saturation not c/w iron deficiency.  B12 and folate normal.  No prior CBCs to assess baseline MCV.  Cannot r/o a thalassemia.  Suspect anemia of chronic disease. 3.  RTC after PET scan and EGD for MD assessment, review of results, and further discussion regarding direction of therapy.    Honor Loh, NP . 12/21/2017, 5:42 PM   I saw and evaluated the patient, participating in the key portions of the service and reviewing pertinent diagnostic studies and records.  I reviewed the nurse practitioner's note and agree with the findings and the plan.  The assessment and plan were discussed with the patient. Multiple questions were asked by the patient and answered.   Nolon Stalls, MD 12/21/2017,5:42 PM

## 2017-12-21 ENCOUNTER — Encounter: Payer: Self-pay | Admitting: Hematology and Oncology

## 2017-12-21 ENCOUNTER — Other Ambulatory Visit: Payer: Self-pay

## 2017-12-21 ENCOUNTER — Inpatient Hospital Stay: Payer: Medicare Other | Attending: Hematology and Oncology | Admitting: Hematology and Oncology

## 2017-12-21 VITALS — BP 137/74 | HR 98 | Temp 94.3°F | Resp 18 | Ht 76.38 in | Wt 151.0 lb

## 2017-12-21 DIAGNOSIS — D509 Iron deficiency anemia, unspecified: Secondary | ICD-10-CM | POA: Diagnosis not present

## 2017-12-21 DIAGNOSIS — C799 Secondary malignant neoplasm of unspecified site: Secondary | ICD-10-CM | POA: Diagnosis not present

## 2017-12-21 DIAGNOSIS — D649 Anemia, unspecified: Secondary | ICD-10-CM | POA: Diagnosis not present

## 2017-12-21 DIAGNOSIS — Z79899 Other long term (current) drug therapy: Secondary | ICD-10-CM

## 2017-12-21 DIAGNOSIS — F17210 Nicotine dependence, cigarettes, uncomplicated: Secondary | ICD-10-CM | POA: Diagnosis not present

## 2017-12-21 NOTE — Progress Notes (Signed)
Pt here for hospital follow up. Unable to give much history as he related he " dont  Know much about why Im here or why I was in the hospital "

## 2017-12-25 ENCOUNTER — Ambulatory Visit
Admission: RE | Admit: 2017-12-25 | Discharge: 2017-12-25 | Disposition: A | Discharge status: Home / Self Care | Payer: Medicare Other | Source: Ambulatory Visit | Attending: Urgent Care | Admitting: Urgent Care

## 2017-12-25 DIAGNOSIS — C799 Secondary malignant neoplasm of unspecified site: Secondary | ICD-10-CM

## 2017-12-25 DIAGNOSIS — C7802 Secondary malignant neoplasm of left lung: Secondary | ICD-10-CM | POA: Insufficient documentation

## 2017-12-25 DIAGNOSIS — D649 Anemia, unspecified: Secondary | ICD-10-CM | POA: Diagnosis not present

## 2017-12-25 DIAGNOSIS — C787 Secondary malignant neoplasm of liver and intrahepatic bile duct: Secondary | ICD-10-CM | POA: Insufficient documentation

## 2017-12-25 DIAGNOSIS — C7801 Secondary malignant neoplasm of right lung: Secondary | ICD-10-CM | POA: Insufficient documentation

## 2017-12-25 LAB — GLUCOSE, CAPILLARY: Glucose-Capillary: 75 mg/dL (ref 70–99)

## 2017-12-25 MED ORDER — FLUDEOXYGLUCOSE F - 18 (FDG) INJECTION
8.3100 | Freq: Once | INTRAVENOUS | Status: AC | PRN
  Administered 2017-12-25: 8.31 via INTRAVENOUS

## 2017-12-29 ENCOUNTER — Other Ambulatory Visit: Payer: Self-pay

## 2017-12-29 ENCOUNTER — Ambulatory Visit: Payer: Medicare Other | Admitting: Anesthesiology

## 2017-12-29 ENCOUNTER — Ambulatory Visit (INDEPENDENT_AMBULATORY_CARE_PROVIDER_SITE_OTHER): Payer: Medicare Other | Admitting: Gastroenterology

## 2017-12-29 ENCOUNTER — Ambulatory Visit
Admission: RE | Admit: 2017-12-29 | Discharge: 2017-12-29 | Disposition: A | Discharge status: Home / Self Care | Payer: Medicare Other | Source: Ambulatory Visit | Attending: Gastroenterology | Admitting: Gastroenterology

## 2017-12-29 ENCOUNTER — Encounter: Payer: Self-pay | Admitting: Gastroenterology

## 2017-12-29 ENCOUNTER — Encounter
Admission: RE | Disposition: A | Discharge status: Home / Self Care | Payer: Self-pay | Source: Ambulatory Visit | Attending: Gastroenterology

## 2017-12-29 VITALS — BP 91/60 | HR 120 | Resp 18 | Ht 76.38 in | Wt 151.8 lb

## 2017-12-29 DIAGNOSIS — K3189 Other diseases of stomach and duodenum: Secondary | ICD-10-CM | POA: Diagnosis present

## 2017-12-29 DIAGNOSIS — C799 Secondary malignant neoplasm of unspecified site: Secondary | ICD-10-CM

## 2017-12-29 DIAGNOSIS — D132 Benign neoplasm of duodenum: Secondary | ICD-10-CM | POA: Insufficient documentation

## 2017-12-29 DIAGNOSIS — Z79890 Hormone replacement therapy (postmenopausal): Secondary | ICD-10-CM | POA: Diagnosis not present

## 2017-12-29 DIAGNOSIS — E079 Disorder of thyroid, unspecified: Secondary | ICD-10-CM | POA: Diagnosis not present

## 2017-12-29 DIAGNOSIS — F17210 Nicotine dependence, cigarettes, uncomplicated: Secondary | ICD-10-CM | POA: Diagnosis not present

## 2017-12-29 DIAGNOSIS — I10 Essential (primary) hypertension: Secondary | ICD-10-CM | POA: Diagnosis not present

## 2017-12-29 HISTORY — PX: ESOPHAGOGASTRODUODENOSCOPY (EGD) WITH PROPOFOL: SHX5813

## 2017-12-29 SURGERY — ESOPHAGOGASTRODUODENOSCOPY (EGD) WITH PROPOFOL
Anesthesia: General

## 2017-12-29 MED ORDER — PHENYLEPHRINE HCL 10 MG/ML IJ SOLN
INTRAMUSCULAR | Status: DC | PRN
  Administered 2017-12-29: 50 ug via INTRAVENOUS

## 2017-12-29 MED ORDER — PROPOFOL 500 MG/50ML IV EMUL
INTRAVENOUS | Status: DC | PRN
  Administered 2017-12-29: 100 ug/kg/min via INTRAVENOUS

## 2017-12-29 MED ORDER — MIDAZOLAM HCL 2 MG/2ML IJ SOLN
INTRAMUSCULAR | Status: AC
  Filled 2017-12-29: qty 2

## 2017-12-29 MED ORDER — PROPOFOL 500 MG/50ML IV EMUL
INTRAVENOUS | Status: AC
  Filled 2017-12-29: qty 50

## 2017-12-29 MED ORDER — PHENYLEPHRINE HCL 10 MG/ML IJ SOLN
INTRAMUSCULAR | Status: AC
  Filled 2017-12-29: qty 1

## 2017-12-29 MED ORDER — LIDOCAINE HCL (PF) 1 % IJ SOLN
INTRAMUSCULAR | Status: AC
  Administered 2017-12-29: 0.3 mL via INTRADERMAL
  Filled 2017-12-29: qty 2

## 2017-12-29 MED ORDER — LIDOCAINE HCL (PF) 1 % IJ SOLN
2.0000 mL | Freq: Once | INTRAMUSCULAR | Status: AC
  Administered 2017-12-29: 0.3 mL via INTRADERMAL

## 2017-12-29 MED ORDER — MIDAZOLAM HCL 2 MG/2ML IJ SOLN
INTRAMUSCULAR | Status: DC | PRN
  Administered 2017-12-29: 1 mg via INTRAVENOUS

## 2017-12-29 MED ORDER — FENTANYL CITRATE (PF) 100 MCG/2ML IJ SOLN
INTRAMUSCULAR | Status: DC | PRN
  Administered 2017-12-29: 50 ug via INTRAVENOUS

## 2017-12-29 MED ORDER — FENTANYL CITRATE (PF) 100 MCG/2ML IJ SOLN
INTRAMUSCULAR | Status: AC
  Filled 2017-12-29: qty 2

## 2017-12-29 MED ORDER — SODIUM CHLORIDE 0.9 % IV SOLN
INTRAVENOUS | Status: DC
  Administered 2017-12-29: 1000 mL via INTRAVENOUS

## 2017-12-29 NOTE — Transfer of Care (Signed)
Immediate Anesthesia Transfer of Care Note  Patient: Shane Russo  Procedure(s) Performed: ESOPHAGOGASTRODUODENOSCOPY (EGD) WITH PROPOFOL (N/A )  Patient Location: PACU and ICU  Anesthesia Type:General  Level of Consciousness: awake and sedated  Airway & Oxygen Therapy: Patient Spontanous Breathing and Patient connected to nasal cannula oxygen  Post-op Assessment: Report given to RN and Post -op Vital signs reviewed and stable  Post vital signs: Reviewed and stable  Last Vitals:  Vitals Value Taken Time  BP    Temp    Pulse    Resp    SpO2      Last Pain:  Vitals:   12/29/17 1218  TempSrc: Tympanic  PainSc: 0-No pain         Complications: No apparent anesthesia complications

## 2017-12-29 NOTE — Anesthesia Procedure Notes (Signed)
Performed by: Cook-Martin, Karna Abed Pre-anesthesia Checklist: Patient identified, Emergency Drugs available, Suction available, Patient being monitored and Timeout performed Patient Re-evaluated:Patient Re-evaluated prior to induction Oxygen Delivery Method: Nasal cannula Preoxygenation: Pre-oxygenation with 100% oxygen Induction Type: IV induction Placement Confirmation: positive ETCO2 and CO2 detector       

## 2017-12-29 NOTE — Anesthesia Post-op Follow-up Note (Signed)
Anesthesia QCDR form completed.        

## 2017-12-29 NOTE — Anesthesia Preprocedure Evaluation (Signed)
Anesthesia Evaluation  Patient identified by MRN, date of birth, ID band Patient awake    Reviewed: Allergy & Precautions, H&P , NPO status , Patient's Chart, lab work & pertinent test results, reviewed documented beta blocker date and time   Airway Mallampati: III   Neck ROM: full    Dental  (+) Poor Dentition, Teeth Intact   Pulmonary neg pulmonary ROS, Current Smoker,    Pulmonary exam normal        Cardiovascular Exercise Tolerance: Poor hypertension, On Medications negative cardio ROS Normal cardiovascular exam Rhythm:regular Rate:Normal     Neuro/Psych negative neurological ROS  negative psych ROS   GI/Hepatic negative GI ROS, Neg liver ROS,   Endo/Other  negative endocrine ROS  Renal/GU negative Renal ROS  negative genitourinary   Musculoskeletal   Abdominal   Peds  Hematology negative hematology ROS (+) Blood dyscrasia, anemia ,   Anesthesia Other Findings Past Medical History: No date: Anemia No date: Hypertension No date: Thyroid disease Past Surgical History: No date: none BMI    Body Mass Index:  18.49 kg/m     Reproductive/Obstetrics negative OB ROS                             Anesthesia Physical Anesthesia Plan  ASA: III  Anesthesia Plan: General   Post-op Pain Management:    Induction:   PONV Risk Score and Plan:   Airway Management Planned:   Additional Equipment:   Intra-op Plan:   Post-operative Plan:   Informed Consent: I have reviewed the patients History and Physical, chart, labs and discussed the procedure including the risks, benefits and alternatives for the proposed anesthesia with the patient or authorized representative who has indicated his/her understanding and acceptance.   Dental Advisory Given  Plan Discussed with: CRNA  Anesthesia Plan Comments:         Anesthesia Quick Evaluation

## 2017-12-29 NOTE — H&P (Signed)
Cephas Darby, MD [address]  [place]  [place], [place] [address]  Main: [phone]  Fax: [phone] Pager: [phone]  Primary Care Physician:  [facility], [facility] Primary Gastroenterologist:  Dr. Cephas Darby  Pre-Procedure History & Physical: HPI:  Shane Russo is a 72 y.o. male is here for an endoscopy.   Past Medical History:  Diagnosis Date  . Anemia   . Hypertension   . Thyroid disease     Past Surgical History:  Procedure Laterality Date  . none      Prior to Admission medications   Medication Sig Start Date End Date Taking? Authorizing Provider  levothyroxine (SYNTHROID, LEVOTHROID) 25 MCG tablet Take 25 mcg by mouth daily. 11/20/17   [provider]  meloxicam (MOBIC) 15 MG tablet Take by mouth.    [provider]  multivitamin (ONE-A-DAY MEN'S) TABS tablet Take 1 tablet by mouth daily.    [provider]    Allergies as of 12/29/2017  . (No Known Allergies)    No family history on file.  Social History   Socioeconomic History  . Marital status: Widowed    Spouse name: Not on file  . Number of children: Not on file  . Years of education: Not on file  . Highest education level: Not on file  Occupational History  . Occupation: retired  Scientific laboratory technician  . Financial resource strain: Not on file  . Food insecurity:    Worry: Not on file    Inability: Not on file  . Transportation needs:    Medical: Not on file    Non-medical: Not on file  Tobacco Use  . Smoking status: Current Every Day Smoker    Packs/day: 0.50    Years: 50.00    Pack years: 25.00    Types: Cigarettes  . Smokeless tobacco: Current User    Types: Chew  Substance and Sexual Activity  . Alcohol use: Yes    Frequency: Never    Comment: a little gin once a day pt stated   . Drug use: Never  . Sexual activity: Not Currently  Lifestyle  . Physical activity:    Days per week: Not on file    Minutes per session: Not on file  .  Stress: Not on file  Relationships  . Social connections:    Talks on phone: Not on file    Gets together: Not on file    Attends religious service: Not on file    Active member of club or organization: Not on file    Attends meetings of clubs or organizations: Not on file    Relationship status: Not on file  . Intimate partner violence:    Fear of current or ex partner: Not on file    Emotionally abused: Not on file    Physically abused: Not on file    Forced sexual activity: Not on file  Other Topics Concern  . Not on file  Social History Narrative  . Not on file    Review of Systems: See HPI, otherwise negative ROS  Physical Exam: BP 90/68   Pulse (!) 112   Temp (!) 96.6 F (35.9 C) (Tympanic)   Resp 18   Ht 6\' 4"  (1.93 m)   Wt 68.9 kg   SpO2 99%   BMI 18.49 kg/m  General:   Alert,  pleasant and cooperative in NAD Head:  Normocephalic and atraumatic. Neck:  Supple; no masses or thyromegaly. Lungs:  Clear  throughout to auscultation.    Heart:  Regular rate and rhythm. Abdomen:  Soft, nontender and nondistended. Normal bowel sounds, without guarding, and without rebound.   Neurologic:  Alert and  oriented x4;  grossly normal neurologically.  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  Questions have been answered.  All parties agreeable.   Sherri Sear, MD  12/29/2017, 12:40 PM

## 2017-12-29 NOTE — Op Note (Signed)
[facility] Gastroenterology Patient Name: Shane Russo Procedure Date: 12/29/2017 12:26 PM MRN: [id number] Account #: [id number] Date of Birth: 08-09-1945 Admit Type: Outpatient Age: 72 Room: [facility] ENDO ROOM 1 Gender: Male Note Status: Finalized Procedure:            Upper GI endoscopy Indications:          metastatic adenocarcinoma Providers:            Lin Landsman MD, MD Referring MD:         Rossmoyne, MD (Referring MD) Medicines:            Monitored Anesthesia Care Complications:        No immediate complications. Estimated blood loss:                        Minimal. Procedure:            Pre-Anesthesia Assessment:                       - Prior to the procedure, a History and Physical was                        performed, and patient medications and allergies were                        reviewed. The patient is competent. The risks and                        benefits of the procedure and the sedation options and                        risks were discussed with the patient. All questions                        were answered and informed consent was obtained.                        Patient identification and proposed procedure were                        verified by the physician, the nurse, the                        anesthesiologist, the anesthetist and the technician in                        the pre-procedure area in the procedure room in the                        endoscopy suite. Mental Status Examination: alert and                        oriented. Airway Examination: normal oropharyngeal                        airway and neck mobility. Respiratory Examination:                        clear to auscultation. CV Examination: normal.  Prophylactic Antibiotics: The patient does not require                        prophylactic antibiotics. Prior Anticoagulants: The                        patient has taken no  previous anticoagulant or                        antiplatelet agents. ASA Grade Assessment: III - A                        patient with severe systemic disease. After reviewing                        the risks and benefits, the patient was deemed in                        satisfactory condition to undergo the procedure. The                        anesthesia plan was to use monitored anesthesia care                        (MAC). Immediately prior to administration of                        medications, the patient was re-assessed for adequacy                        to receive sedatives. The heart rate, respiratory rate,                        oxygen saturations, blood pressure, adequacy of                        pulmonary ventilation, and response to care were                        monitored throughout the procedure. The physical status                        of the patient was re-assessed after the procedure.                       After obtaining informed consent, the endoscope was                        passed under direct vision. Throughout the procedure,                        the patient's blood pressure, pulse, and oxygen                        saturations were monitored continuously. The Endoscope                        was introduced through the mouth, and advanced to the  second part of duodenum. The upper GI endoscopy was                        accomplished without difficulty. The patient tolerated                        the procedure well. Findings:      A medium-sized infiltrative mass with no bleeding was found in the roof       of duodenal bulb, this probably represents liver tumor eroding into the       duodenal bulb. Biopsies were taken with a cold forceps for histology.      The second portion of the duodenum was normal.      The entire examined stomach was normal.      The cardia and gastric fundus were normal on retroflexion.      The  gastroesophageal junction and examined esophagus were normal. Impression:           - Rule out malignancy, duodenal mass. Biopsied.                       - Normal second portion of the duodenum.                       - Normal stomach.                       - Normal gastroesophageal junction and esophagus. Recommendation:       - Await pathology results, perform upper EUS if deemed                        necessary by oncology                       - No repeat upper endoscopy.                       - Discharge patient to home (with escort).                       - Resume previous diet today.                       - Continue present medications.                       - F/u with oncology Procedure Code(s):    --- Professional ---                       [phone], Esophagogastroduodenoscopy, flexible, transoral;                        with biopsy, single or multiple Diagnosis Code(s):    --- Professional ---                       K31.89, Other diseases of stomach and duodenum CPT copyright 2018 American Medical Association. All rights reserved. The codes documented in this report are preliminary and upon coder review may  be revised to meet current compliance requirements. Dr. Ulyess Mort Lin Landsman MD, MD 12/29/2017 1:15:23 PM This report has been signed electronically. Number of Addenda: 0 Note Initiated On: 12/29/2017 12:26  PM      [facility]

## 2017-12-29 NOTE — Progress Notes (Signed)
Cephas Darby, MD [address]  [place]  [place], [place]so  Main: [phone]  Fax: [phone]    Gastroenterology Consultation  Referring Provider:     [facility], [place] Physician:  [facility], [facility] Primary Gastroenterologist:  Dr. Cephas Darby Reason for Consultation:     Metastatic adenocarcinoma, requesting EGD        HPI:   Shane Russo is a 72 y.o. Russo referred by Dr. Mike Gip  for consultation & management of recent diagnosis of metastatic adenocarcinoma, presumably lung.  Patient with history of 20-30-pack-year smoking, weight loss, lack of appetite, microcytic anemia who was admitted to [facility] with the above symptoms.  On imaging, he was found to have multiple liver lesions involving both in right and left lobes consistent with liver mets, largest lesion measuring 9.4 x 9.8 cm.  There were also multiple lung nodules in the lung bases consistent with metastatic involvement of the lungs.  He also had 4.8 cm solid central left lower lobe lung mass consistent with primary bronchogenic adenocarcinoma.  Patient underwent ultrasound-guided liver biopsy which revealed adenocarcinoma with extracellular mucin and extensive necrosis.  The histology was suggestive of primary hepatic cholangiocarcinoma, adenocarcinoma pancreas gallbladder or upper GI.  Patient is seen by Dr. Susy Manor who recommended upper endoscopy to evaluate for esophageal or gastric carcinoma rather than repeating liver biopsy.  Patient provides limited history.  He denies trouble swallowing, epigastric pain.  He is accompanied by his son today.   NSAIDs: None  Antiplts/Anticoagulants/Anti thrombotics: None  GI Procedures: None  Past Medical History:  Diagnosis Date  . Anemia   . Hypertension   . Thyroid disease     Past Surgical History:  Procedure Laterality Date  . none      Current Outpatient Medications:  .  levothyroxine (SYNTHROID, LEVOTHROID) 25 MCG  tablet, Take 25 mcg by mouth daily., Disp: , Rfl: 1 .  meloxicam (MOBIC) 15 MG tablet, Take by mouth., Disp: , Rfl:  .  multivitamin (ONE-A-DAY MEN'S) TABS tablet, Take 1 tablet by mouth daily., Disp: , Rfl:    No family history on file.   Social History   Tobacco Use  . Smoking status: Current Every Day Smoker    Packs/day: 0.50    Years: 50.00    Pack years: 25.00    Types: Cigarettes  . Smokeless tobacco: Current User    Types: Chew  Substance Use Topics  . Alcohol use: Yes    Frequency: Never    Comment: a little gin once a day pt stated   . Drug use: Never    Allergies as of 12/29/2017  . (No Known Allergies)    Review of Systems:    All systems reviewed and negative except where noted in HPI.   Physical Exam:  BP 91/60 (BP Location: Left Arm, Patient Position: Sitting, Cuff Size: Normal)   Pulse (!) 120   Resp 18   Ht 6' 4.38" (1.94 m)   Wt 151 lb 12.8 oz (68.9 kg)   BMI 18.29 kg/m  No LMP for Russo patient.  General:   Alert, thin built, tall, pleasant and cooperative in NAD Head:  Normocephalic and atraumatic. Eyes:  Sclera clear, no icterus.   Conjunctiva pink. Ears:  Normal auditory acuity. Nose:  No deformity, discharge, or lesions. Mouth:  No deformity or lesions,oropharynx pink & moist. Neck:  Supple; no masses or thyromegaly. Lungs:  Respirations even and unlabored.  Clear throughout to auscultation.  No wheezes, crackles, or rhonchi. No acute distress. Heart:  Regular rate and rhythm; no murmurs, clicks, rubs, or gallops. Abdomen:  Normal bowel sounds. Soft, non-tender and non-distended without masses, hepatosplenomegaly or hernias noted.  No guarding or rebound tenderness.   Rectal: Not performed Msk:  Symmetrical without gross deformities. Good, equal movement & strength bilaterally. Pulses:  Normal pulses noted. Extremities:  No clubbing or edema.  No cyanosis. Neurologic:  Alert and oriented x3;  grossly normal neurologically. Skin:  Intact  without significant lesions or rashes. No jaundice. Lymph Nodes:  No significant cervical adenopathy. Psych:  Alert and cooperative. Normal mood and affect.  Imaging Studies: Reviewed  Assessment and Plan:   Shane Russo is a 72 y.o. African-American Russo with history of tobacco use, new diagnosis of metastatic adenocarcinoma to liver, lungs, and adrenals, primary bronchogenic carcinoma based on imaging.  Referred here for upper endoscopy to look for any esophageal or gastric source of cancer.  -Schedule EGD today as patient did not eat breakfast today.  His last meal was at 7 PM yesterday.  I have discussed alternative options, risks & benefits,  which include, but are not limited to, bleeding, infection, perforation,respiratory complication & drug reaction.  The patient agrees with this plan & written consent will be obtained.    Follow up based on the EGD results   Cephas Darby, MD

## 2017-12-30 ENCOUNTER — Encounter: Payer: Self-pay | Admitting: Gastroenterology

## 2017-12-30 NOTE — Anesthesia Postprocedure Evaluation (Signed)
Anesthesia Post Note  Patient: Shane Russo  Procedure(s) Performed: ESOPHAGOGASTRODUODENOSCOPY (EGD) WITH PROPOFOL (N/A )  Patient location during evaluation: PACU Anesthesia Type: General Level of consciousness: awake and alert Pain management: pain level controlled Vital Signs Assessment: post-procedure vital signs reviewed and stable Respiratory status: spontaneous breathing, nonlabored ventilation, respiratory function stable and patient connected to nasal cannula oxygen Cardiovascular status: blood pressure returned to baseline and stable Postop Assessment: no apparent nausea or vomiting Anesthetic complications: no     Last Vitals:  Vitals:   12/29/17 1336 12/29/17 1346  BP: 95/66 92/69  Pulse: 94 93  Resp: 17 (!) 21  Temp:    SpO2: 99% 95%    Last Pain:  Vitals:   12/29/17 1346  TempSrc:   PainSc: 0-No pain                 Molli Barrows

## 2017-12-31 LAB — SURGICAL PATHOLOGY

## 2018-01-01 ENCOUNTER — Other Ambulatory Visit: Payer: Self-pay

## 2018-01-01 ENCOUNTER — Inpatient Hospital Stay: Payer: Medicare Other | Admitting: Hematology and Oncology

## 2018-01-01 ENCOUNTER — Telehealth: Payer: Self-pay

## 2018-01-01 NOTE — Telephone Encounter (Signed)
Received request for EUS from Honor Loh, NP. EUS scheduled for 10/31 with Dr. Cephas Darby at [facility] with son, Vonna Kotyk and reviewed procedure and instructions. Copy of instructions provided via email as well. Provided my contact information for any future questions or needs.  INSTRUCTIONS FOR ENDOSCOPIC ULTRASOUND -Your procedure has been scheduled for October 31st with Dr. Cephas Darby at  [facility]. -The hospital may contact you to pre-register over the phone.  -To get your scheduled arrival time, please call the Endoscopy unit at  [phone] between 1-3 p.m. on:  October 30th    -ON THE DAY OF YOU PROCEDURE:   1. If you are scheduled for a morning procedure, nothing to drink after midnight  -If you are scheduled for an afternoon procedure, you may have clear liquids until 5 hours prior  to the procedure but no carbonated drinks or broth  2. NO FOOD THE DAY OF YOUR PROCEDURE  3. You may take your heart, seizure, blood pressure, Parkinson's or breathing medications at  6am with just enough water to get your pills down  4. Do not take any oral Diabetic medications the morning of your procedure.  5. If you are a diabetic and are using insulin, please notify your prescribing physician of this  procedure, as your dose may need to be altered related to not being able to eat or drink.   6. Do not take vitamins, iron, or fish oil for 5 days before your procedure     -On the day of your procedure, come to the [facility] Admitting/Registration desk (First desk on the right) at the scheduled arrival time. You MUST have someone drive you home from your procedure. You must have a responsible adult with a valid driver's license who is on site throughout your entire procedure and who can stay with you for several hours after your procedure. You may not go home alone in a taxi, shuttle [place] or bus, as the drivers will not be responsible for you.  --If you have any  questions please call me at the above contact Oncology Nurse Navigator Documentation  Navigator Location: [facility] Onc (01/01/18 0800)   )Navigator Encounter Type: Telephone (01/01/18 0800) Telephone: Outgoing Call;Education (01/01/18 0800)                       Barriers/Navigation Needs: Coordination of Care (01/01/18 0800)   Interventions: Coordination of Care (01/01/18 0800)   Coordination of Care: EUS (01/01/18 0800)                  Time Spent with Patient: 30 (01/01/18 0800)

## 2018-01-06 ENCOUNTER — Ambulatory Visit: Payer: Medicare Other | Admitting: Gastroenterology

## 2018-01-06 ENCOUNTER — Encounter: Payer: Self-pay | Admitting: *Deleted

## 2018-01-07 ENCOUNTER — Encounter: Payer: Self-pay | Admitting: Anesthesiology

## 2018-01-07 ENCOUNTER — Encounter: Admission: RE | Payer: Self-pay | Source: Ambulatory Visit

## 2018-01-07 ENCOUNTER — Ambulatory Visit: Admission: RE | Admit: 2018-01-07 | Payer: Medicare Other | Source: Ambulatory Visit

## 2018-01-07 ENCOUNTER — Other Ambulatory Visit: Payer: Medicare Other

## 2018-01-07 SURGERY — ULTRASOUND, UPPER GI TRACT, ENDOSCOPIC
Anesthesia: General

## 2018-01-07 MED ORDER — PROPOFOL 500 MG/50ML IV EMUL
INTRAVENOUS | Status: AC
  Filled 2018-01-07: qty 50

## 2018-01-07 MED ORDER — FENTANYL CITRATE (PF) 100 MCG/2ML IJ SOLN
INTRAMUSCULAR | Status: AC
  Filled 2018-01-07: qty 2

## 2018-01-07 NOTE — Progress Notes (Signed)
Tumor Board Documentation  Shane Russo was presented by Garnette Czech our Tumor Board on 01/07/2018, which included representatives from medical oncology, radiation oncology, radiology, surgical, surgical oncology, internal medicine, pharmacy, navigation, pathology, research, pulmonology.  Shane Russo currently presents as a current patient with history of the following treatments: none.  Additionally, we reviewed previous medical and familial history, history of present illness, and recent lab results along with all available histopathologic and imaging studies. The tumor board considered available treatment options and made the following recommendations:   EUS 01/07/18  The following procedures/referrals were also placed: No orders of the defined types were placed in this encounter.   Clinical Trial Status: unknown   Staging used:    National site-specific guidelines   were discussed with respect to the case.  Tumor board is a meeting of clinicians from various specialty areas who evaluate and discuss patients for whom a multidisciplinary approach is being considered. Final determinations in the plan of care are those of the provider(s). The responsibility for follow up of recommendations given during tumor board is that of the provider.   Today's extended care, comprehensive team conference, Shane Russo was not present for the discussion and was not examined.

## 2018-01-08 ENCOUNTER — Telehealth: Payer: Self-pay

## 2018-01-08 ENCOUNTER — Inpatient Hospital Stay: Payer: Medicare Other | Admitting: Hematology and Oncology

## 2018-01-08 NOTE — Telephone Encounter (Signed)
Called and spoke with Shane Russo with recommendation from Dr. Mike Gip to have EUS sooner. We will arrange at [facility] and they will contact him for scheduling.

## 2018-01-08 NOTE — Telephone Encounter (Signed)
Unfortunately, Shane Russo is booked into December as well for EUS. I have offered to send to [place] would like to stay more local. He is asking if it is safe to wait until December or have it done sooner at [facility] Dr. Mike Gip please advise. Oncology Nurse Navigator Documentation  Navigator Location: [facility] (01/08/18 0900)   )Navigator Encounter Type: Telephone (01/08/18 0900) Telephone: Incoming Call;Outgoing Call (01/08/18 0900)                               Coordination of Care: EUS (01/08/18 0900)                  Time Spent with Patient: 15 (01/08/18 0900)

## 2018-01-08 NOTE — Telephone Encounter (Signed)
  I would recommend sooner.  M

## 2018-01-08 NOTE — Telephone Encounter (Signed)
Called and spoke with son, Vonna Kotyk, regarding cancelled EUS. His father wanted to eat and since he has not been eating much lately, he decided to eat and cancelled his EUS. Unfortunately [place] is booked into December. He would like EUS performed earlier and hopefully an appointment earlier in the day. Offered referral to [facility] or [facility] for EUS. He would like EUS performed in [place]. Information sent to [facility] to arrange. Notified Vonna Kotyk that they would contact him to arrange. We will arrange follow up with Dr. Mike Gip once EUS is scheduled. Oncology Nurse Navigator Documentation  Navigator Location: [facility] (01/08/18 0800)   )Navigator Encounter Type: Telephone (01/08/18 0800) Telephone: [facility] Crocker Call (01/08/18 0800)                               Coordination of Care: EUS (01/08/18 0800)                  Time Spent with Patient: 15 (01/08/18 0800)

## 2018-01-15 ENCOUNTER — Telehealth: Payer: Self-pay

## 2018-01-15 NOTE — Telephone Encounter (Signed)
EUS has been arranged with Dr. Cephas Darby at [facility], 01-21-18 at [place]. He will need follow up with Dr. Mike Gip for results. Oncology Nurse Navigator Documentation  Navigator Location: [facility] (01/15/18 1000)   )Navigator Encounter Type: Letter/Fax/Email (01/15/18 1000)                                 Coordination of Care: EUS (01/15/18 1000)                  Time Spent with Patient: 15 (01/15/18 1000)

## 2018-01-28 ENCOUNTER — Inpatient Hospital Stay: Payer: Medicare Other

## 2018-01-28 ENCOUNTER — Inpatient Hospital Stay: Payer: Medicare Other | Attending: Hematology and Oncology | Admitting: Hematology and Oncology

## 2018-01-28 ENCOUNTER — Encounter: Payer: Self-pay | Admitting: Hematology and Oncology

## 2018-01-28 ENCOUNTER — Encounter (INDEPENDENT_AMBULATORY_CARE_PROVIDER_SITE_OTHER): Payer: Self-pay

## 2018-01-28 VITALS — BP 123/70 | HR 120 | Temp 98.2°F | Resp 18 | Wt 140.1 lb

## 2018-01-28 DIAGNOSIS — I10 Essential (primary) hypertension: Secondary | ICD-10-CM

## 2018-01-28 DIAGNOSIS — C772 Secondary and unspecified malignant neoplasm of intra-abdominal lymph nodes: Secondary | ICD-10-CM | POA: Diagnosis not present

## 2018-01-28 DIAGNOSIS — D509 Iron deficiency anemia, unspecified: Secondary | ICD-10-CM | POA: Insufficient documentation

## 2018-01-28 DIAGNOSIS — C787 Secondary malignant neoplasm of liver and intrahepatic bile duct: Secondary | ICD-10-CM | POA: Insufficient documentation

## 2018-01-28 DIAGNOSIS — R634 Abnormal weight loss: Secondary | ICD-10-CM | POA: Diagnosis not present

## 2018-01-28 DIAGNOSIS — C7801 Secondary malignant neoplasm of right lung: Secondary | ICD-10-CM

## 2018-01-28 DIAGNOSIS — E079 Disorder of thyroid, unspecified: Secondary | ICD-10-CM

## 2018-01-28 DIAGNOSIS — C799 Secondary malignant neoplasm of unspecified site: Secondary | ICD-10-CM

## 2018-01-28 DIAGNOSIS — C801 Malignant (primary) neoplasm, unspecified: Secondary | ICD-10-CM | POA: Diagnosis not present

## 2018-01-28 DIAGNOSIS — E46 Unspecified protein-calorie malnutrition: Secondary | ICD-10-CM

## 2018-01-28 DIAGNOSIS — F17210 Nicotine dependence, cigarettes, uncomplicated: Secondary | ICD-10-CM | POA: Diagnosis not present

## 2018-01-28 DIAGNOSIS — C7971 Secondary malignant neoplasm of right adrenal gland: Secondary | ICD-10-CM | POA: Insufficient documentation

## 2018-01-28 DIAGNOSIS — Z515 Encounter for palliative care: Secondary | ICD-10-CM | POA: Insufficient documentation

## 2018-01-28 DIAGNOSIS — C7802 Secondary malignant neoplasm of left lung: Secondary | ICD-10-CM | POA: Diagnosis not present

## 2018-01-28 DIAGNOSIS — Z7189 Other specified counseling: Secondary | ICD-10-CM

## 2018-01-28 MED ORDER — DRONABINOL 2.5 MG PO CAPS: 2.5000 mg | ORAL_CAPSULE | Freq: Two times a day (BID) | ORAL | 0 refills | Status: AC

## 2018-01-28 MED ORDER — CYANOCOBALAMIN 1000 MCG/ML IJ SOLN
1000.0000 ug | Freq: Once | INTRAMUSCULAR | Status: AC
  Administered 2018-01-28: 1000 ug via INTRAMUSCULAR
  Filled 2018-01-28: qty 1

## 2018-01-28 MED ORDER — FOLIC ACID 1 MG PO TABS: 1.0000 mg | ORAL_TABLET | Freq: Every day | ORAL | 3 refills | Status: DC

## 2018-01-28 MED ORDER — ONDANSETRON HCL 4 MG PO TABS: 4.0000 mg | ORAL_TABLET | Freq: Three times a day (TID) | ORAL | 0 refills | Status: DC | PRN

## 2018-01-28 NOTE — Progress Notes (Signed)
[facility] day:  01/28/2018  Chief Complaint: Shane Russo is a 72 y.o. male with metastatic adenocarcinoma who is seen for assessment after interval endoscopic ultrasound and biopsy.  HPI: The patient was last seen in the medical oncology clinic on 12/21/2017.  At that time, he was seen for assessment after interval hospitalization.  Evaluation revealed a 4.5 cm mass in the superior segment of the left lower lobe and > 20 small bilateral lung nodules.  There were multiple liver lesions c/w metastasis.  The largest lesion was 9.4 x 9.8 cm.  Ultrasound guided liver biopsy revealed adenocarcinoma with extracellular mucin and extensive necrosis.  Cytokeratin 7 was positive.  Cytokeratin 20, TTF-1, napsin, and CDX-2 were negative.  Differential diagnosis included primary hepatic cholangiocarcinoma; adenocarcinoma of the pancreas, gallbladder or upper GI (stomach, distal esophagus). There was insufficient tissue for ancillary molecular testing.  PET scan on 12/25/2017 revealed metastatic disease to the liver, lungs, right adrenal gland, and possibly a porta hepatis lymph node. The favored primary site was a dominant mass in the left lower lobe. The areas of more diffuse hypermetabolism, including the lung lesions and the posterior right hepatic lobe mass, would be amendable to highest yield sampling.  There was trace right pleural fluid and small volume pelvic ascites  Upper endoscopy by Dr. Marius Russo on 12/29/2017 revealed a medium-sized infiltrative mass in the roof of duodenal bulb.  This probably represented liver tumor eroding into the duodenal bulb.  Pathology revealed duodenal mucosa with a benign Brunner gland proliferative lesion, negative for dysplasia or malignancy.  He underwent upper endoscopic ultrasound by Dr. Robin Russo on 01/21/2018.  There was extrinsic compression of the gastric antrum leading to significant deformity.  There was abnormal appearing,  infiltrative and friable mucosa in the duodenal bulb concerning for local tumor infiltration.  Pancreatic parenchymal abnormalities consisting of hypoechoic strands and lobularity were noted in the pancreatic neck, body, and tail.  The pancreatic head was not well visualized due to extrinsic compression within the gastric antrum. The pancreatic duct had a normal endoscopic appearance in the body and tail of the pancreas.  One malignant appearing lymph node was found in the left lobe of the liver.  Two metastatic lesions were found in the left lobe of the liver.  Pathology revealed adenocarcinoma with abundant necrosis.  Immunohistochemical stains were attempted on block A1, however, deeper sections contain mostly necrotic debris with essentially no viable tumor for evaluation. A CK7 shows positive expression in predominantly necrotic tissue. CK20, TTF-1 and CDX2 are negative, but no viable tumor cells are present for evaluation. Findings are not specific for a particular site of origin.  During the interim, patient has been doing "pretty good". Son notes that patient seems to be getting weaker. He spends the majority of his day rest. Patient is only OOB x 5 hours a day. He maintains the ability to complete all of his ADLs independently. Patient denies that he has experienced any B symptoms. He denies any interval infections.   Patient with nausea and vomiting x 2 days ago. Patient not currently on antiemetic medications.   Patient advises that he maintains a poor appetite overall. His son notes that his intake is poor. He is drinking Ensure x 2 daily. Weight today is 140 lb 2 oz (63.6 kg), which compared to his last visit to the clinic, represents an 11 pound decrease.   Patient denies pain in the clinic today.    Past Medical  History:  Diagnosis Date  . Anemia   . Hypertension   . Thyroid disease     Past Surgical History:  Procedure Laterality Date  . ESOPHAGOGASTRODUODENOSCOPY (EGD) WITH  PROPOFOL N/A 12/29/2017   Procedure: ESOPHAGOGASTRODUODENOSCOPY (EGD) WITH PROPOFOL;  Surgeon: Shane Landsman, MD;  Location: [facility];  Service: Gastroenterology;  Laterality: N/A;  . none      History reviewed. No pertinent family history.  Social History:  reports that he has been smoking cigarettes. He has a 25.00 pack-year smoking history. His smokeless tobacco use includes chew. He reports that he drinks about 7.0 standard drinks of alcohol per week. He reports that he does not use drugs.  He currently smokes 1/3 pack/day.  He started smoking as a child.  He previously drank a 5th Russo day.  He currently drinks a pint a week.  He denies any exposure to radiation or toxins.  He previously was a Museum/gallery curator.  He lives in a house with 3 other people (1 family member and 2 friends).  He is accompanied by his son, Shane Russo.  Allergies: No Known Allergies  Current Medications: Current Outpatient Medications  Medication Sig Dispense Refill  . levothyroxine (SYNTHROID, LEVOTHROID) 25 MCG tablet Take 25 mcg by mouth daily.  1  . multivitamin (ONE-A-DAY MEN'S) TABS tablet Take 1 tablet by mouth daily.     No current facility-administered medications for this visit.     Review of Systems  Constitutional: Positive for malaise/fatigue and weight loss (?? 8 pound increase since discharge - per son's report). Negative for chills, diaphoresis and fever.       Feels "pretty good".  Weak.  Out of bed 5 hours/day.  HENT: Negative.  Negative for congestion, ear discharge, ear pain, nosebleeds, sinus pain and sore throat.   Eyes: Negative.  Negative for blurred vision, double vision, photophobia, pain, discharge and redness.  Respiratory: Negative.  Negative for cough, hemoptysis, sputum production and shortness of breath.   Cardiovascular: Negative.  Negative for palpitations, orthopnea, leg swelling and PND.  Gastrointestinal: Positive for nausea (x 2 days) and vomiting (x 2 days).  Negative for abdominal pain, blood in stool, constipation, diarrhea and melena.       Poor appetite.  Eating when up.  Interval endoscopies.  Genitourinary: Negative.  Negative for dysuria, frequency, hematuria and urgency.  Musculoskeletal: Negative.  Negative for back pain, falls, joint pain, myalgias and neck pain.  Skin: Negative.  Negative for itching and rash.  Neurological: Positive for weakness (generalized). Negative for dizziness, tingling, tremors, sensory change, speech change, focal weakness and headaches.  Endo/Heme/Allergies: Does not bruise/bleed easily.  Psychiatric/Behavioral: Negative for depression and memory loss. The patient is not nervous/anxious and does not have insomnia.        HYPERsomnia  All other systems reviewed and are negative.  Performance status (ECOG): 2  Vital Signs BP 123/70 (BP Location: Left Arm, Patient Position: Sitting)   Pulse (!) 120   Temp 98.2 F (36.8 C) (Tympanic)   Resp 18   Wt 140 lb 2 oz (63.6 kg)   BMI 17.06 kg/m   Physical Exam  Constitutional: He is oriented to person, place, and time. No distress.  Chronically fatigued appearing gentleman in no acute distress.  HENT:  Head: Normocephalic and atraumatic.  Mouth/Throat: Oropharynx is clear and moist and mucous membranes are normal. Abnormal dentition (poor). No oropharyngeal exudate.  Near alopecia.  Temporal wasting.  Eyes: Pupils are equal, round, and reactive to light. EOM  are normal. No scleral icterus.  Brown eyes.  Neck: Normal range of motion. Neck supple. No JVD present.  Cardiovascular: Normal rate, regular rhythm, normal heart sounds and intact distal pulses. Exam reveals no gallop and no friction rub.  No murmur heard. Pulmonary/Chest: Effort normal and breath sounds normal. No respiratory distress. He has no wheezes. He has no rales.  Abdominal: Soft. Bowel sounds are normal. He exhibits no distension. There is hepatomegaly (marked). There is no tenderness.   Musculoskeletal: Normal range of motion. He exhibits no edema or tenderness.  Lymphadenopathy:    He has no cervical adenopathy.    He has no axillary adenopathy.       Right: No inguinal and no supraclavicular adenopathy present.       Left: No inguinal and no supraclavicular adenopathy present.  Neurological: He is alert and oriented to person, place, and time.  Skin: Skin is warm and dry. No rash noted. He is not diaphoretic. No erythema. Nails show clubbing.  Psychiatric: Mood, affect and judgment normal.  Nursing note and vitals reviewed.   No visits with results within 3 Day(s) from this visit.  Latest known visit with results is:  Admission on 12/14/2017, Discharged on 12/16/2017  Component Date Value Ref Range Status  . Sodium 12/14/2017 137  135 - 145 mmol/L Final  . Potassium 12/14/2017 3.0* 3.5 - 5.1 mmol/L Final  . Chloride 12/14/2017 97* 98 - 111 mmol/L Final  . CO2 12/14/2017 24  22 - 32 mmol/L Final  . Glucose, Bld 12/14/2017 137* 70 - 99 mg/dL Final  . BUN 12/14/2017 10  8 - 23 mg/dL Final  . Creatinine, Ser 12/14/2017 0.70  0.61 - 1.24 mg/dL Final  . Calcium 12/14/2017 8.9  8.9 - 10.3 mg/dL Final  . GFR calc non Af Amer 12/14/2017 >60  >60 mL/min Final  . GFR calc Af Amer 12/14/2017 >60  >60 mL/min Final   Comment: (NOTE) The eGFR has been calculated using the CKD EPI equation. This calculation has not been validated in all clinical situations. eGFR's persistently <60 mL/min signify possible Chronic Kidney Disease.   Georgiann Hahn gap 12/14/2017 16* 5 - 15 Final   Performed at [facility], [address] [place], [place] [address]  . WBC 12/14/2017 18.2* 3.8 - 10.6 K/uL Final  . RBC 12/14/2017 3.77* 4.40 - 5.90 MIL/uL Final  . Hemoglobin 12/14/2017 9.1* 13.0 - 18.0 g/dL Final  . HCT 12/14/2017 29.2* 40.0 - 52.0 % Final  . MCV 12/14/2017 77.6* 80.0 - 100.0 fL Final  . MCH 12/14/2017 24.2* 26.0 - 34.0 pg Final  . MCHC 12/14/2017 31.2* 32.0 - 36.0 g/dL  Final  . RDW 12/14/2017 15.8* 11.5 - 14.5 % Final  . Platelets 12/14/2017 574* 150 - 440 K/uL Final   Performed at [facility], [address] [place], [place] [address]  . Color, Urine 12/15/2017 AMBER* YELLOW Final   BIOCHEMICALS MAY BE AFFECTED BY COLOR  . APPearance 12/15/2017 CLEAR* CLEAR Final  . Specific Gravity, Urine 12/15/2017 >1.046* 1.005 - 1.030 Final  . pH 12/15/2017 5.0  5.0 - 8.0 Final  . Glucose, UA 12/15/2017 NEGATIVE  NEGATIVE mg/dL Final  . Hgb urine dipstick 12/15/2017 NEGATIVE  NEGATIVE Final  . Bilirubin Urine 12/15/2017 NEGATIVE  NEGATIVE Final  . Ketones, ur 12/15/2017 NEGATIVE  NEGATIVE mg/dL Final  . Protein, ur 12/15/2017 NEGATIVE  NEGATIVE mg/dL Final  . Nitrite 12/15/2017 NEGATIVE  NEGATIVE Final  . Leukocytes, UA 12/15/2017 NEGATIVE  NEGATIVE Final  . RBC / HPF 12/15/2017 0-5  0 - 5 RBC/hpf Final  . WBC, UA 12/15/2017 0-5  0 - 5 WBC/hpf Final  . Bacteria, UA 12/15/2017 NONE SEEN  NONE SEEN Final  . Squamous Epithelial / LPF 12/15/2017 0-5  0 - 5 Final  . Mucus 12/15/2017 PRESENT   Final   Performed at [facility], [address], [place], [place] [address]  . ABO/RH(D) 12/14/2017 A POS   Final  . Antibody Screen 12/14/2017 NEG   Final  . Sample Expiration 12/14/2017 12/17/2017   Final  . Unit Number 12/14/2017 [id number]   Final  . Blood Component Type 12/14/2017 RBC LR PHER1   Final  . Unit division 12/14/2017 00   Final  . Status of Unit 12/14/2017 ISSUED,FINAL   Final  . Transfusion Status 12/14/2017 OK TO TRANSFUSE   Final  . Crossmatch Result 12/14/2017    Final                   Value:Compatible Performed at [facility], [address] [place], [place] [address]   . Specimen Description 12/14/2017 BLOOD RIGHT ANTECUBITAL   Final  . Special Requests 12/14/2017 BOTTLES DRAWN AEROBIC AND ANAEROBIC Blood Culture results may not be optimal due to an excessive volume of blood received in culture bottles   Final   . Culture 12/14/2017    Final                   Value:NO GROWTH 5 DAYS Performed at [facility], [address], [place], [place] [address]   . Report Status 12/14/2017 12/19/2017 FINAL   Final  . Specimen Description 12/14/2017 BLOOD BLOOD LEFT HAND   Final  . Special Requests 12/14/2017 BOTTLES DRAWN AEROBIC AND ANAEROBIC Blood Culture adequate volume   Final  . Culture 12/14/2017    Final                   Value:NO GROWTH 5 DAYS Performed at [facility], [address] [place], [place] [address]   . Report Status 12/14/2017 12/19/2017 FINAL   Final  . Lactic Acid, Venous 12/14/2017 3.7* 0.5 - 1.9 mmol/L Final   Comment: CRITICAL RESULT CALLED TO, READ BACK BY AND VERIFIED WITH [place] WALKER AT 4034 12/14/17 DAS Performed at [facility], [address], [place], [place] [address]   . Troponin I 12/14/2017 <0.03  <0.03 ng/mL Final   Performed at [facility], [address] [place], [place] [address]  . Lactic Acid, Venous 12/14/2017 2.9* 0.5 - 1.9 mmol/L Final   Comment: CRITICAL RESULT CALLED TO, READ BACK BY AND VERIFIED WITH BRANDY MILLER AT 1840 12/14/2017.  TFK Performed at [facility], [address], [place], [place] [address]   . Ferritin 12/15/2017 1,326* 24 - 336 ng/mL Final   Performed at [facility], [address] [place], [place] [address]  . Iron 12/15/2017 23* 45 - 182 ug/dL Final  . TIBC 12/15/2017 134* 250 - 450 ug/dL Final  . Saturation Ratios 12/15/2017 17* 17.9 - 39.5 % Final  . UIBC 12/15/2017 111  ug/dL Final   Performed at [facility], [address] [place], [place] [address]  . Vitamin B-12 12/15/2017 553  180 - 914 pg/mL Final   Comment: (NOTE) This assay is not validated for testing neonatal or myeloproliferative syndrome specimens for Vitamin B12 levels. Performed at [facility], [place]  [address], [place], [place] [address]   . Folate 12/15/2017 6.4  >5.9 ng/mL  Final   Performed at [facility], [address] [place], [place] [address]  . WBC 12/15/2017 15.3* 3.8 - 10.6 K/uL Final  . RBC 12/15/2017 2.77* 4.40 - 5.90 MIL/uL Final  . Hemoglobin 12/15/2017 6.9* 13.0 - 18.0 g/dL Final   RESULT REPEATED AND VERIFIED  . HCT 12/15/2017 21.4* 40.0 - 52.0 % Final  . MCV 12/15/2017 77.3* 80.0 - 100.0 fL Final  . MCH 12/15/2017 25.0* 26.0 - 34.0 pg Final  . MCHC 12/15/2017 32.4  32.0 - 36.0 g/dL Final  . RDW 12/15/2017 15.7* 11.5 - 14.5 % Final  . Platelets 12/15/2017 446* 150 - 440 K/uL Final  . Neutrophils Relative % 12/15/2017 82  % Final  . Neutro Abs 12/15/2017 12.6* 1.4 - 6.5 K/uL Final  . Lymphocytes Relative 12/15/2017 5  % Final  . Lymphs Abs 12/15/2017 0.8* 1.0 - 3.6 K/uL Final  . Monocytes Relative 12/15/2017 11  % Final  . Monocytes Absolute 12/15/2017 1.7* 0.2 - 1.0 K/uL Final  . Eosinophils Relative 12/15/2017 1  % Final  . Eosinophils Absolute 12/15/2017 0.1  0 - 0.7 K/uL Final  . Basophils Relative 12/15/2017 1  % Final  . Basophils Absolute 12/15/2017 0.1  0 - 0.1 K/uL Final   Performed at [facility], [address] [place], [place] [address]  . CEA 12/15/2017 444.0* 0.0 - 4.7 ng/mL Final   Comment: (NOTE)                             Nonsmokers          <3.9                             Smokers             <5.6 Roche [employer]ostics Electrochemiluminescence Immunoassay (ECLIA) Values obtained with different assay methods or kits cannot be used interchangeably.  Results cannot be interpreted as absolute evidence of the presence or absence of malignant disease. Performed At: [facility] [place], [place] [id number] Rush Farmer MD YH:[id number]   . AFP, Serum, Tumor Marker 12/15/2017 9.0* 0.0 - 8.3 ng/mL Final   Comment: (NOTE) Roche Diagnostics Electrochemiluminescence Immunoassay (ECLIA) Values obtained with different assay methods or kits cannot be used interchangeably.  Results  cannot be interpreted as absolute evidence of the presence or absence of malignant disease. This test is not interpretable in pregnant females. Performed At: [facility] [place], [place] [id number] Rush Farmer MD XT:[id number]   . Prothrombin Time 12/15/2017 14.7  11.4 - 15.2 seconds Final  . INR 12/15/2017 1.16   Final   Performed at [facility], [address] [place], [place] [address]  . Sodium 12/15/2017 134* 135 - 145 mmol/L Final  . Potassium 12/15/2017 3.1* 3.5 - 5.1 mmol/L Final  . Chloride 12/15/2017 102  98 - 111 mmol/L Final  . CO2 12/15/2017 25  22 - 32 mmol/L Final  . Glucose, Bld 12/15/2017 97  70 - 99 mg/dL Final  . BUN 12/15/2017 9  8 - 23 mg/dL Final  . Creatinine, Ser 12/15/2017 0.61  0.61 - 1.24 mg/dL Final  . Calcium 12/15/2017 7.8* 8.9 - 10.3 mg/dL Final  . Total Protein 12/15/2017 6.1* 6.5 - 8.1 g/dL Final  . Albumin 12/15/2017 2.4*  3.5 - 5.0 g/dL Final  . AST 12/15/2017 46* 15 - 41 U/L Final  . ALT 12/15/2017 14  0 - 44 U/L Final  . Alkaline Phosphatase 12/15/2017 141* 38 - 126 U/L Final  . Total Bilirubin 12/15/2017 1.3* 0.3 - 1.2 mg/dL Final  . GFR calc non Af Amer 12/15/2017 >60  >60 mL/min Final  . GFR calc Af Amer 12/15/2017 >60  >60 mL/min Final   Comment: (NOTE) The eGFR has been calculated using the CKD EPI equation. This calculation has not been validated in all clinical situations. eGFR's persistently <60 mL/min signify possible Chronic Kidney Disease.   Georgiann Hahn gap 12/15/2017 7  5 - 15 Final   Performed at [facility], [address] [place], [place] [address]  . Order Confirmation 12/15/2017    Final                   Value:ORDER PROCESSED BY BLOOD BANK Performed at [facility], [address] [place], [place] [address]   . ABO/RH(D) 12/15/2017    Final                   Value:A POS Performed at [facility], [address] [place], [place] [address]   . ISSUE DATE /  TIME 12/14/2017 [id number]   Final  . Blood Product Unit Number 12/14/2017 [id number]   Final  . PRODUCT CODE 12/14/2017 [id number]   Final  . Unit Type and Rh 12/14/2017 6200   Final  . Blood Product Expiration Date 12/14/2017 [id number]   Final  . MRSA by PCR 12/15/2017 NEGATIVE  NEGATIVE Final   Comment:        The GeneXpert MRSA Assay (FDA approved for NASAL specimens only), is one component of a comprehensive MRSA colonization surveillance program. It is not intended to diagnose MRSA infection nor to guide or monitor treatment for MRSA infections. Performed at [facility], [address] [place], [place] [address]   . Magnesium 12/15/2017 1.5* 1.7 - 2.4 mg/dL Final   Performed at [facility], [address] [place], [place] [address]  . Retic Ct Pct 12/15/2017 2.6  0.4 - 3.1 % Final  . RBC. 12/15/2017 2.67* 4.22 - 5.81 MIL/uL Final  . Retic Count, Absolute 12/15/2017 68.6  19.0 - 186.0 K/uL Final  . Immature Retic Fract 12/15/2017 32.5* 2.3 - 15.9 % Final   Performed at [facility], [address], [place], [place] [address]  . SURGICAL PATHOLOGY 12/16/2017    Final                   Value:Surgical Pathology CASE: [id number] PATIENT: Bonnetta Barry JR Surgical Pathology Report  SPECIMEN SUBMITTED: A. Liver; biopsy  CLINICAL HISTORY: Dominant 4.8 cm lung mass with numerous bilateral lung nodules and several liver lesions.  Current Russo day smoker.  PRE-OPERATIVE DIAGNOSIS: Metastatic disease, suspect a lung primary  POST-OPERATIVE DIAGNOSIS: Same as above  DIAGNOSIS: A. LIVER, ULTRASOUND-GUIDED BIOPSY: - ADENOCARCINOMA WITH EXTRACELLULAR MUCIN AND EXTENSIVE NECROSIS.  There is insufficient material for ancillary molecular testing.  Comment: A limited panel of immunohistochemical stains was performed with the following pattern of immunoreactivity: Cytokeratin 7: Positive Cytokeratin 20: Negative TTF-1:  Negative Napsin: Negative CDX 2: Negative The differential diagnosis includes primary hepatic cholangiocarcinoma; adenocarcinoma of the pancreas, gallbladder or upper GI (stomach, distal esophagus). These findings were communic  ated to Dr. Mike Gip on 12/18/2017 via [facility] secure text.  IHC slides were prepared by [facility], [place], [place]. All controls stained appropriately.  This test was developed and its performance characteristics determined by [facility]. It has not been cleared or approved by the [place] Food and Drug Administration. The FDA does not require this test to go through premarket FDA review. This test is used for clinical purposes. It should not be regarded as investigational or for research. This laboratory is certified under the Clinical Laboratory Improvement Amendments (CLIA) as qualified to perform high complexity clinical laboratory testing.  GROSS DESCRIPTION: A. Labeled: Liver mass biopsy Received: In formalin Tissue fragment(s): Multiple cores and fragments Size: Aggregate, 4.1 x 0.1 x 0.1 cm Description: Pink-tan cores and fragments Entirely submitted in two cassettes.  Final Diagnosis performed by Quay Burow, MD.   Electronical                         ly signed 12/18/2017 3:44:59PM The electronic signature indicates that the named Attending Pathologist has evaluated the specimen  Technical component performed at [facility], [address], [place], [place] [address] Lab: [phone] Dir: Rush Farmer, MD, MMM  Professional component performed at [facility], [facility], [place], [place], [place] [address] Lab: [phone] Dir: Dellia Nims. Rubinas, MD   . WBC 12/16/2017 18.4* 4.0 - 10.5 K/uL Final  . RBC 12/16/2017 3.60* 4.22 - 5.81 MIL/uL Final  . Hemoglobin 12/16/2017 9.1* 13.0 - 17.0 g/dL Final  . HCT 12/16/2017 27.9* 39.0 - 52.0 % Final  . MCV 12/16/2017 77.5* 80.0 - 100.0  fL Final  . MCH 12/16/2017 25.3* 26.0 - 34.0 pg Final  . MCHC 12/16/2017 32.6  30.0 - 36.0 g/dL Final  . RDW 12/16/2017 15.2  11.5 - 15.5 % Final  . Platelets 12/16/2017 495* 150 - 400 K/uL Final  . nRBC 12/16/2017 0.0  0.0 - 0.2 % Final   Performed at [facility], [address], [place], [place] [address]  . Sodium 12/16/2017 134* 135 - 145 mmol/L Final  . Potassium 12/16/2017 3.2* 3.5 - 5.1 mmol/L Final  . Chloride 12/16/2017 100  98 - 111 mmol/L Final  . CO2 12/16/2017 23  22 - 32 mmol/L Final  . Glucose, Bld 12/16/2017 115* 70 - 99 mg/dL Final  . BUN 12/16/2017 6* 8 - 23 mg/dL Final  . Creatinine, Ser 12/16/2017 0.64  0.61 - 1.24 mg/dL Final  . Calcium 12/16/2017 8.2* 8.9 - 10.3 mg/dL Final  . GFR calc non Af Amer 12/16/2017 >60  >60 mL/min Final  . GFR calc Af Amer 12/16/2017 >60  >60 mL/min Final   Comment: (NOTE) The eGFR has been calculated using the CKD EPI equation. This calculation has not been validated in all clinical situations. eGFR's persistently <60 mL/min signify possible Chronic Kidney Disease.   Georgiann Hahn gap 12/16/2017 11  5 - 15 Final   Performed at [facility], [address] [place],  [address]    Assessment:  Shane Russo is a 71 y.o. male with metastatic adenocarcinoma s/p liver biopsy on 12/16/2017.  The majority of the sample was necrotic.  Pathology revealed adenocarcinoma with extracellular mucin and extensive necrosis.  Cytokeratin 7 was positive.  Cytokeratin 20, TTF-1, napsin, and CDX-2 was negative.  Differential diagnosis included primary hepatic cholangiocarcinoma; adenocarcinoma of the pancreas, gallbladder or upper GI (stomach, distal esophagus). There was insufficient tissue for ancillary molecular  testing.  CEA was 444 and AFP was 9.0 (0-8.3) on 12/15/2017.  Abdomen and pelvic CT on 12/14/2017 revealed multiple liver lesions throughout both the right and left lobes consistent with liver metastasis. Largest lesion was 9.4 x  9.8 cm.  Multicentric hepatoma cannot be excluded.  There were multiple lung nodules at the lung bases left-greater-than-right c/w metastatic involvement of the lungs.   There was a small amount of ascites.  Chest CT on 12/15/2017 revealed a 4.8 cm solid central left lower lobe lung mass c/w primary bronchogenic carcinoma.  There were > 20 solid pulmonary nodules of various sizes scattered throughout both lungs.  There was no thoracic adenopathy.  There was a trace dependent right pleural effusion.  There was an indeterminate right adrenal nodule suspicious for adrenal metastasis.  PET scan on 12/25/2017 revealed metastatic disease to the liver, lungs, right adrenal gland, and possibly a porta hepatis lymph node. The favored primary site was a dominant mass in the left lower lobe. The areas of more diffuse hypermetabolism, including the lung lesions and the posterior right hepatic lobe mass, would be amendable to highest yield sampling.  There was trace right pleural fluid and small volume pelvic ascites  Upper endoscopy on 12/29/2017 revealed a medium-sized infiltrative mass in the roof of duodenal bulb.  This probably represented liver tumor eroding into the duodenal bulb.  Pathology was benign.  Upper endoscopic ultrasound on 01/21/2018 revealed extrinsic compression of the gastric antrum leading to significant deformity.  There was abnormal appearing, infiltrative and friable mucosa in the duodenal bulb concerning for local tumor infiltration.  Pancreatic parenchymal abnormalities consisting of hypoechoic strands and lobularity were noted in the pancreatic neck, body, and tail.  The pancreatic head was not well visualized due to extrinsic compression within the gastric antrum. The pancreatic duct had a normal endoscopic appearance in the body and tail of the pancreas.  One malignant appearing lymph node was found in the left lobe of the liver.  Two metastatic lesions were found in the left lobe of the  liver.  Pathology from the upper endoscopic ultrasound revealed adenocarcinoma with abundant necrosis.  Immunohistochemical stains were attempted on block A1, however, deeper sections contain mostly necrotic debris with essentially no viable tumor for evaluation. A CK7 shows positive expression in predominantly necrotic tissue. CK20, TTF-1 and CDX2 are negative, but no viable tumor cells are present for evaluation. Findings are not specific for a particular site of origin.  He has a microcytic anemia.  Anemia work-up on 03/16/2017 included: B12 (553) and folate (6.4).  Ferritin was 1326.  Iron saturation was 17% with a TIBC of 134.  Retic was 2.6%  Symptomatically, he remains fatigued.  He has lost weight.  He denies any pain.  Exam is stable.  Plan: 1.   Metastatic adenocarcinoma: Discuss interval endoscopies.  Malignancy is not arising from upper GI tract, but tumor is causing extrinsic compression. Etiology of malignancy appears to be from primary hepatic cholangiocarcinoma or possibly lung cancer.   Follow-up with pathology from [facility] and Dr. Cephas Darby. Discuss plans for initiation of chemotherapy (carboplatin + Alimta + Keytruda OR cisplatin + gemcitabine).   Discuss plan for chemotherapy class, port-a-cath placement, and initiation of chemotherapy next week.  Patient undecided about treatment. Patient will receive B12 today and initiate folic acid in the event that chemotherapy includes Alimta for adenocarcinoma of the lung. Schedule head MRI. 2.  Weight loss:  Discuss appetite stimulant (Marinol).  Side effects reviewed.  Discuss ondansetron prn  nausea.  Schedule follow-up with dietician.  Samples of Boost Max (C 160, F2, carb 7, p 30).  Samples of Boost Plus (C 360, F14, carb 45, p 14). 3.  RTC based on pathology and chemotherapy decision.   Honor Loh, NP . 01/28/2018, 10:02 AM   I saw and evaluated the patient, participating in the key portions of the service and reviewing  pertinent diagnostic studies and records.  I reviewed the nurse practitioner's note and agree with the findings and the plan.  The assessment and plan were discussed with the patient. Multiple questions were asked by the patient and answered.   Nolon Stalls, MD 01/28/2018,10:02 AM

## 2018-01-28 NOTE — Progress Notes (Signed)
Patient here as hospital follow up.  Offers no complaints today.  Patient is accompanied by his son Shane Russo today.

## 2018-02-01 ENCOUNTER — Other Ambulatory Visit (INDEPENDENT_AMBULATORY_CARE_PROVIDER_SITE_OTHER): Payer: Self-pay | Admitting: Nurse Practitioner

## 2018-02-01 ENCOUNTER — Inpatient Hospital Stay: Payer: Medicare Other

## 2018-02-01 ENCOUNTER — Encounter: Admission: RE | Payer: Self-pay | Source: Ambulatory Visit

## 2018-02-01 ENCOUNTER — Ambulatory Visit: Admission: RE | Admit: 2018-02-01 | Payer: Medicare Other | Source: Ambulatory Visit | Admitting: Vascular Surgery

## 2018-02-01 SURGERY — PORTA CATH INSERTION
Anesthesia: Moderate Sedation

## 2018-02-01 NOTE — Progress Notes (Signed)
Nutrition  Patient was a no show for nutrition appointment today.  Messages sent to Dr. Mike Gip and scheduling pool to try and reschedule.   Tauheedah Bok B. Zenia Resides, [place], [place] Registered Dietitian [phone] (pager)

## 2018-02-03 ENCOUNTER — Telehealth: Payer: Self-pay | Admitting: Hematology and Oncology

## 2018-02-03 NOTE — Telephone Encounter (Signed)
Re:  Diagnosis and treatment options  I spoke to the patient and his son, Shane Russo, today.  We discussed his recent procedures. 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  We discussed the option for chemotherapy.  He was unsure.  We discussed Hospice.  He initially was interested in Hospice then went back to chemotherapy.  I discussed meeting after Thanksgiving to make a final decision.  We discussed port placement for chemotherapy.  I discussed an appointment with Altha Harm, palliative care, at the same time.  He and his son were in agreement.   Lequita Asal, MD

## 2018-02-08 ENCOUNTER — Inpatient Hospital Stay: Payer: Medicare Other | Attending: Hematology and Oncology

## 2018-02-09 ENCOUNTER — Telehealth: Payer: Self-pay

## 2018-02-09 NOTE — Telephone Encounter (Signed)
Call placed to [facility] to discuss plan of care regarding treatment vs. Hospice. No answer and no voicemail at this time.  Oncology Nurse Navigator Documentation  Navigator Location: [facility] (02/09/18 1500)   )Navigator Encounter Type: Telephone (02/09/18 1500) Telephone: Outgoing Call (02/09/18 1500)                                                  Time Spent with Patient: 15 (02/09/18 1500)

## 2018-02-09 NOTE — Telephone Encounter (Signed)
Call placed to [facility] to discuss plan of care regarding treatment vs. Hospice. No answer and no voicemail at this time. I will attempt call later today.

## 2018-02-10 ENCOUNTER — Telehealth: Payer: Self-pay

## 2018-02-10 NOTE — Telephone Encounter (Signed)
Call placed to [facility] to discuss plan of care regarding treatment vs. Hospice. No answer and no voicemail at this time. Oncology Nurse Navigator Documentation  Navigator Location: [facility] (02/10/18 1300)   )Navigator Encounter Type: Telephone (02/10/18 1300) Telephone: [place] Call (02/10/18 1300)                                                  Time Spent with Patient: 15 (02/10/18 1300)

## 2018-02-11 ENCOUNTER — Emergency Department: Payer: Medicare Other

## 2018-02-11 ENCOUNTER — Other Ambulatory Visit: Payer: Self-pay

## 2018-02-11 ENCOUNTER — Inpatient Hospital Stay: Payer: Medicare Other | Admitting: Oncology

## 2018-02-11 ENCOUNTER — Inpatient Hospital Stay
Admission: EM | Admit: 2018-02-11 | Discharge: 2018-02-15 | DRG: 871 | Disposition: A | Discharge status: Hospice | Payer: Medicare Other | Attending: Internal Medicine | Admitting: Internal Medicine

## 2018-02-11 ENCOUNTER — Telehealth: Payer: Self-pay | Admitting: *Deleted

## 2018-02-11 ENCOUNTER — Inpatient Hospital Stay: Payer: Medicare Other

## 2018-02-11 ENCOUNTER — Encounter: Payer: Self-pay | Admitting: Emergency Medicine

## 2018-02-11 DIAGNOSIS — Z681 Body mass index (BMI) 19 or less, adult: Secondary | ICD-10-CM | POA: Diagnosis not present

## 2018-02-11 DIAGNOSIS — Z515 Encounter for palliative care: Secondary | ICD-10-CM

## 2018-02-11 DIAGNOSIS — N183 Chronic kidney disease, stage 3 (moderate): Secondary | ICD-10-CM | POA: Diagnosis present

## 2018-02-11 DIAGNOSIS — E870 Hyperosmolality and hypernatremia: Secondary | ICD-10-CM | POA: Diagnosis present

## 2018-02-11 DIAGNOSIS — E039 Hypothyroidism, unspecified: Secondary | ICD-10-CM | POA: Diagnosis present

## 2018-02-11 DIAGNOSIS — E861 Hypovolemia: Secondary | ICD-10-CM | POA: Diagnosis present

## 2018-02-11 DIAGNOSIS — C7971 Secondary malignant neoplasm of right adrenal gland: Secondary | ICD-10-CM | POA: Diagnosis present

## 2018-02-11 DIAGNOSIS — C7801 Secondary malignant neoplasm of right lung: Secondary | ICD-10-CM | POA: Diagnosis present

## 2018-02-11 DIAGNOSIS — I129 Hypertensive chronic kidney disease with stage 1 through stage 4 chronic kidney disease, or unspecified chronic kidney disease: Secondary | ICD-10-CM | POA: Diagnosis present

## 2018-02-11 DIAGNOSIS — Z79890 Hormone replacement therapy (postmenopausal): Secondary | ICD-10-CM

## 2018-02-11 DIAGNOSIS — R6521 Severe sepsis with septic shock: Secondary | ICD-10-CM | POA: Diagnosis present

## 2018-02-11 DIAGNOSIS — E876 Hypokalemia: Secondary | ICD-10-CM | POA: Diagnosis not present

## 2018-02-11 DIAGNOSIS — F1011 Alcohol abuse, in remission: Secondary | ICD-10-CM | POA: Diagnosis present

## 2018-02-11 DIAGNOSIS — D630 Anemia in neoplastic disease: Secondary | ICD-10-CM | POA: Diagnosis present

## 2018-02-11 DIAGNOSIS — Z66 Do not resuscitate: Secondary | ICD-10-CM | POA: Diagnosis present

## 2018-02-11 DIAGNOSIS — R64 Cachexia: Secondary | ICD-10-CM

## 2018-02-11 DIAGNOSIS — R579 Shock, unspecified: Secondary | ICD-10-CM | POA: Diagnosis not present

## 2018-02-11 DIAGNOSIS — E872 Acidosis: Secondary | ICD-10-CM | POA: Diagnosis present

## 2018-02-11 DIAGNOSIS — N179 Acute kidney failure, unspecified: Secondary | ICD-10-CM | POA: Diagnosis present

## 2018-02-11 DIAGNOSIS — C7802 Secondary malignant neoplasm of left lung: Secondary | ICD-10-CM | POA: Diagnosis present

## 2018-02-11 DIAGNOSIS — R531 Weakness: Secondary | ICD-10-CM

## 2018-02-11 DIAGNOSIS — E875 Hyperkalemia: Secondary | ICD-10-CM

## 2018-02-11 DIAGNOSIS — E43 Unspecified severe protein-calorie malnutrition: Secondary | ICD-10-CM | POA: Diagnosis present

## 2018-02-11 DIAGNOSIS — F17210 Nicotine dependence, cigarettes, uncomplicated: Secondary | ICD-10-CM | POA: Diagnosis present

## 2018-02-11 DIAGNOSIS — C787 Secondary malignant neoplasm of liver and intrahepatic bile duct: Secondary | ICD-10-CM | POA: Diagnosis not present

## 2018-02-11 DIAGNOSIS — A419 Sepsis, unspecified organism: Principal | ICD-10-CM | POA: Diagnosis present

## 2018-02-11 DIAGNOSIS — G9340 Encephalopathy, unspecified: Secondary | ICD-10-CM | POA: Diagnosis present

## 2018-02-11 DIAGNOSIS — C799 Secondary malignant neoplasm of unspecified site: Secondary | ICD-10-CM

## 2018-02-11 DIAGNOSIS — E162 Hypoglycemia, unspecified: Secondary | ICD-10-CM | POA: Diagnosis not present

## 2018-02-11 DIAGNOSIS — C229 Malignant neoplasm of liver, not specified as primary or secondary: Secondary | ICD-10-CM

## 2018-02-11 DIAGNOSIS — E860 Dehydration: Secondary | ICD-10-CM | POA: Diagnosis present

## 2018-02-11 DIAGNOSIS — C221 Intrahepatic bile duct carcinoma: Secondary | ICD-10-CM | POA: Diagnosis present

## 2018-02-11 DIAGNOSIS — R627 Adult failure to thrive: Secondary | ICD-10-CM | POA: Diagnosis present

## 2018-02-11 LAB — COMPREHENSIVE METABOLIC PANEL
ALK PHOS: 365 U/L — AB (ref 38–126)
ALT: 29 U/L (ref 0–44)
AST: 105 U/L — ABNORMAL HIGH (ref 15–41)
Albumin: 2.6 g/dL — ABNORMAL LOW (ref 3.5–5.0)
Anion gap: 17 — ABNORMAL HIGH (ref 5–15)
BILIRUBIN TOTAL: 7 mg/dL — AB (ref 0.3–1.2)
BUN: 37 mg/dL — ABNORMAL HIGH (ref 8–23)
CALCIUM: 10.2 mg/dL (ref 8.9–10.3)
CO2: 20 mmol/L — ABNORMAL LOW (ref 22–32)
CREATININE: 1.82 mg/dL — AB (ref 0.61–1.24)
Chloride: 112 mmol/L — ABNORMAL HIGH (ref 98–111)
GFR, EST AFRICAN AMERICAN: 42 mL/min — AB (ref >60)
GFR, EST NON AFRICAN AMERICAN: 36 mL/min — AB (ref >60)
Glucose, Bld: 84 mg/dL (ref 70–99)
Potassium: 3.6 mmol/L (ref 3.5–5.1)
Sodium: 149 mmol/L — ABNORMAL HIGH (ref 135–145)
TOTAL PROTEIN: 7.4 g/dL (ref 6.5–8.1)

## 2018-02-11 LAB — URINALYSIS, COMPLETE (UACMP) WITH MICROSCOPIC
Bacteria, UA: NONE SEEN
Glucose, UA: NEGATIVE mg/dL
Ketones, ur: NEGATIVE mg/dL
Leukocytes, UA: NEGATIVE
Nitrite: NEGATIVE
Protein, ur: 30 mg/dL — AB
Specific Gravity, Urine: 1.018 (ref 1.005–1.030)
pH: 5 (ref 5.0–8.0)

## 2018-02-11 LAB — MRSA PCR SCREENING: MRSA by PCR: NEGATIVE

## 2018-02-11 LAB — CBC WITH DIFFERENTIAL/PLATELET
Abs Immature Granulocytes: 0.29 10*3/uL — ABNORMAL HIGH (ref 0.00–0.07)
BASOS PCT: 0 %
Basophils Absolute: 0.1 10*3/uL (ref 0.0–0.1)
EOS ABS: 0.1 10*3/uL (ref 0.0–0.5)
EOS PCT: 0 %
HCT: 25.1 % — ABNORMAL LOW (ref 39.0–52.0)
HEMOGLOBIN: 7.6 g/dL — AB (ref 13.0–17.0)
Immature Granulocytes: 1 %
Lymphocytes Relative: 5 %
Lymphs Abs: 1.1 10*3/uL (ref 0.7–4.0)
MCH: 24.1 pg — AB (ref 26.0–34.0)
MCHC: 30.3 g/dL (ref 30.0–36.0)
MCV: 79.7 fL — AB (ref 80.0–100.0)
Monocytes Absolute: 1.4 10*3/uL — ABNORMAL HIGH (ref 0.1–1.0)
Monocytes Relative: 6 %
NRBC: 0.2 % (ref 0.0–0.2)
Neutro Abs: 21.5 10*3/uL — ABNORMAL HIGH (ref 1.7–7.7)
Neutrophils Relative %: 88 %
Platelets: 431 10*3/uL — ABNORMAL HIGH (ref 150–400)
RBC: 3.15 MIL/uL — ABNORMAL LOW (ref 4.22–5.81)
RDW: 21.3 % — ABNORMAL HIGH (ref 11.5–15.5)
WBC: 24.6 10*3/uL — ABNORMAL HIGH (ref 4.0–10.5)

## 2018-02-11 LAB — ABO/RH: ABO/RH(D): A POS

## 2018-02-11 LAB — GLUCOSE, CAPILLARY: Glucose-Capillary: 92 mg/dL (ref 70–99)

## 2018-02-11 LAB — AMMONIA: Ammonia: 28 umol/L (ref 9–35)

## 2018-02-11 LAB — PROCALCITONIN: Procalcitonin: 14.44 ng/mL

## 2018-02-11 LAB — PROTIME-INR
INR: 1.27
PROTHROMBIN TIME: 15.8 s — AB (ref 11.4–15.2)

## 2018-02-11 LAB — PREPARE RBC (CROSSMATCH)

## 2018-02-11 LAB — LACTIC ACID, PLASMA
Lactic Acid, Venous: 4.9 mmol/L (ref 0.5–1.9)
Lactic Acid, Venous: 3.6 mmol/L (ref 0.5–1.9)
Lactic Acid, Venous: 2.2 mmol/L (ref 0.5–1.9)

## 2018-02-11 MED ORDER — VANCOMYCIN HCL IN DEXTROSE 750-5 MG/150ML-% IV SOLN
750.0000 mg | INTRAVENOUS | Status: DC
  Administered 2018-02-11: 750 mg via INTRAVENOUS
  Filled 2018-02-11 (×2): qty 150

## 2018-02-11 MED ORDER — SODIUM CHLORIDE 0.9 % IV BOLUS
1000.0000 mL | Freq: Once | INTRAVENOUS | Status: AC
  Administered 2018-02-11: 1000 mL via INTRAVENOUS

## 2018-02-11 MED ORDER — ONDANSETRON HCL 4 MG/2ML IJ SOLN: 4.0000 mg | Freq: Four times a day (QID) | INTRAMUSCULAR | Status: DC | PRN

## 2018-02-11 MED ORDER — DRONABINOL 2.5 MG PO CAPS: 2.5000 mg | ORAL_CAPSULE | Freq: Two times a day (BID) | ORAL | Status: DC

## 2018-02-11 MED ORDER — DEXTROSE 5 % IV SOLN
INTRAVENOUS | Status: DC
  Administered 2018-02-11 – 2018-02-13 (×4): via INTRAVENOUS

## 2018-02-11 MED ORDER — ADULT MULTIVITAMIN W/MINERALS CH: 1.0000 | ORAL_TABLET | Freq: Every day | ORAL | Status: DC

## 2018-02-11 MED ORDER — ACETAMINOPHEN 650 MG RE SUPP: 650.0000 mg | Freq: Four times a day (QID) | RECTAL | Status: DC | PRN

## 2018-02-11 MED ORDER — SODIUM CHLORIDE 0.9 % IV SOLN
Freq: Once | INTRAVENOUS | Status: AC
  Administered 2018-02-11: 15:00:00 via INTRAVENOUS

## 2018-02-11 MED ORDER — ENSURE ENLIVE PO LIQD: 237.0000 mL | Freq: Two times a day (BID) | ORAL | Status: DC

## 2018-02-11 MED ORDER — SODIUM CHLORIDE 0.9 % IV SOLN
2.0000 g | Freq: Two times a day (BID) | INTRAVENOUS | Status: DC
  Filled 2018-02-11 (×2): qty 2

## 2018-02-11 MED ORDER — SODIUM CHLORIDE 0.9 % IV SOLN: 10.0000 mL/h | Freq: Once | INTRAVENOUS | Status: DC

## 2018-02-11 MED ORDER — SODIUM CHLORIDE 0.9 % IV SOLN
2.0000 g | Freq: Once | INTRAVENOUS | Status: AC
  Administered 2018-02-11: 2 g via INTRAVENOUS
  Filled 2018-02-11: qty 2

## 2018-02-11 MED ORDER — PIPERACILLIN-TAZOBACTAM 3.375 G IVPB
3.3750 g | Freq: Three times a day (TID) | INTRAVENOUS | Status: DC
  Administered 2018-02-11 – 2018-02-12 (×2): 3.375 g via INTRAVENOUS
  Filled 2018-02-11 (×2): qty 50

## 2018-02-11 MED ORDER — SODIUM CHLORIDE 0.9 % IV SOLN
Freq: Once | INTRAVENOUS | Status: AC
  Administered 2018-02-11: 13:00:00 via INTRAVENOUS

## 2018-02-11 MED ORDER — VANCOMYCIN HCL IN DEXTROSE 1-5 GM/200ML-% IV SOLN
1000.0000 mg | Freq: Once | INTRAVENOUS | Status: AC
  Administered 2018-02-11: 1000 mg via INTRAVENOUS
  Filled 2018-02-11: qty 200

## 2018-02-11 MED ORDER — LEVOTHYROXINE SODIUM 25 MCG PO TABS
25.0000 ug | ORAL_TABLET | Freq: Every day | ORAL | Status: DC
  Filled 2018-02-11: qty 1

## 2018-02-11 MED ORDER — ACETAMINOPHEN 325 MG PO TABS: 650.0000 mg | ORAL_TABLET | Freq: Four times a day (QID) | ORAL | Status: DC | PRN

## 2018-02-11 MED ORDER — ONDANSETRON HCL 4 MG PO TABS: 4.0000 mg | ORAL_TABLET | Freq: Four times a day (QID) | ORAL | Status: DC | PRN

## 2018-02-11 MED ORDER — FOLIC ACID 1 MG PO TABS: 1.0000 mg | ORAL_TABLET | Freq: Every day | ORAL | Status: DC

## 2018-02-11 MED ORDER — METRONIDAZOLE IN NACL 5-0.79 MG/ML-% IV SOLN
500.0000 mg | Freq: Three times a day (TID) | INTRAVENOUS | Status: DC
  Administered 2018-02-11: 500 mg via INTRAVENOUS
  Filled 2018-02-11 (×4): qty 100

## 2018-02-11 MED ORDER — HEPARIN SODIUM (PORCINE) 5000 UNIT/ML IJ SOLN
5000.0000 [IU] | Freq: Three times a day (TID) | INTRAMUSCULAR | Status: DC
  Administered 2018-02-11 – 2018-02-13 (×7): 5000 [IU] via SUBCUTANEOUS
  Filled 2018-02-11 (×8): qty 1

## 2018-02-11 NOTE — H&P (Signed)
[facility] NAME: Shane Russo    MR#:  [id number]  DATE OF BIRTH:  12-31-1945  DATE OF ADMISSION:  02/11/2018  PRIMARY CARE PHYSICIAN: Patient, No Pcp Per   REQUESTING/REFERRING PHYSICIAN: Dr. Merlyn Lot  CHIEF COMPLAINT:   Chief Complaint  Patient presents with  . Failure To Thrive    HISTORY OF PRESENT ILLNESS:  Shane Russo  is a 72 y.o. 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 intake.  Patient himself cannot give a good history therefore most of the history obtained from the ER physician.  I attempted to call the patient's son who brought the patient to the hospital but could not get in touch with him.  Patient apparently has had poor p.o. intake over the past few days and has been more weak lethargic and more confused therefore the patient's son brought him to the hospital.  In the ER patient was noted to be hypotensive, noted to be in acute kidney injury, with hypernatremia and therefore hospitalist services were contacted for admission.  Patient met sepsis criteria given elevated lactic acid, hypotension and leukocytosis but the source remains unclear.  Patient has received some IV antibiotics empirically here in the ER.  PAST MEDICAL HISTORY:   Past Medical History:  Diagnosis Date  . Anemia   . Hypertension   . Thyroid disease     PAST SURGICAL HISTORY:   Past Surgical History:  Procedure Laterality Date  . ESOPHAGOGASTRODUODENOSCOPY (EGD) WITH PROPOFOL N/A 12/29/2017   Procedure: ESOPHAGOGASTRODUODENOSCOPY (EGD) WITH PROPOFOL;  Surgeon: Lin Landsman, MD;  Location: [place];  Service: Gastroenterology;  Laterality: N/A;  . none      SOCIAL HISTORY:   Social History   Tobacco Use  . Smoking  status: Current Every Day Smoker    Packs/day: 0.50    Years: 50.00    Pack years: 25.00    Types: Cigarettes  . Smokeless tobacco: Current User    Types: Chew  Substance Use Topics  . Alcohol use: Yes    Alcohol/week: 7.0 standard drinks    Types: 7 Shots of liquor per week    Frequency: Never    Comment: a little gin once a day pt stated     FAMILY HISTORY:  History reviewed. No pertinent family history.  DRUG ALLERGIES:  No Known Allergies  REVIEW OF SYSTEMS:   Review of Systems  Unable to perform ROS: Mental acuity    MEDICATIONS AT HOME:   Prior to Admission medications   Medication Sig Start Date End Date Taking? Authorizing Provider  dronabinol (MARINOL) 2.5 MG capsule Take 1 capsule (2.5 mg total) by mouth 2 (two) times daily before a meal. 01/28/18  Yes Karen Kitchens, NP  folic acid (FOLVITE) 1 MG tablet Take 1 tablet (1 mg total) by mouth daily. 01/28/18  Yes Karen Kitchens, NP  levothyroxine (SYNTHROID, LEVOTHROID) 25 MCG tablet Take 25 mcg by mouth daily. 11/20/17  Yes [provider]  multivitamin (ONE-A-DAY MEN'S) TABS tablet Take 1 tablet by mouth daily.   Yes [provider]  ondansetron (ZOFRAN) 4 MG tablet Take 1 tablet (4 mg total) by mouth every 8 (eight) hours as needed for nausea or vomiting. 01/28/18  Yes Karen Kitchens, NP  VITAL SIGNS:  Blood pressure (!) 89/56, pulse (!) 47, temperature (!) 94.8 F (34.9 C), temperature source Axillary, resp. rate 19, height _0  (1.803 m), weight 63.6 kg, SpO2 98 %.  PHYSICAL EXAMINATION:  Physical Exam  GENERAL:  72 y.o.-year-old thin, cachectic patient lying in the bed in no acute distress.  EYES: Pupils equal, round, reactive to light and accommodation. + scleral icterus. Extraocular muscles intact.  HEENT: Head atraumatic, normocephalic. Oropharynx and nasopharynx clear. No oropharyngeal erythema, moist oral mucosa  NECK:  Supple, no jugular venous distention. No thyroid enlargement,  no tenderness.  LUNGS: Poor Resp. effort, no wheezing, rales, rhonchi. No use of accessory muscles of respiration.  CARDIOVASCULAR: S1, S2 RRR. No murmurs, rubs, gallops, clicks.  ABDOMEN: Soft, nontender, nondistended. Bowel sounds present. No organomegaly or mass.  EXTREMITIES: No pedal edema, cyanosis, or clubbing. + 2 pedal & radial pulses b/l.   NEUROLOGIC: Cranial nerves II through XII are intact. No focal Motor or sensory deficits appreciated b/l. Globally weak.  PSYCHIATRIC: The patient is alert and oriented x 1. SKIN: No obvious rash, lesion, or ulcer.   LABORATORY PANEL:   CBC Recent Labs  Lab 02/11/18 1138  WBC 24.6*  HGB 7.6*  HCT 25.1*  PLT 431*   ------------------------------------------------------------------------------------------------------------------  Chemistries  Recent Labs  Lab 02/11/18 1138  NA 149*  K 3.6  CL 112*  CO2 20*  GLUCOSE 84  BUN 37*  CREATININE 1.82*  CALCIUM 10.2  AST 105*  ALT 29  ALKPHOS 365*  BILITOT 7.0*   ------------------------------------------------------------------------------------------------------------------  Cardiac Enzymes No results for input(s): TROPONINI in the last 168 hours. ------------------------------------------------------------------------------------------------------------------  RADIOLOGY:  Dg Chest Portable 1 View  Result Date: 02/11/2018 CLINICAL DATA:  Failure to thrive, loss of 50 pounds over the last year, history of lung carcinoma EXAM: PORTABLE CHEST 1 VIEW COMPARISON:  PET-CT of 12/25/2017 and chest x-ray of 12/14/2017 FINDINGS: The left lower lobe mass appears to have increased in size consistent with progression of lung carcinoma. Also there are several nodular opacities in the right lower lobe most consistent with metastases contra laterally with a probable 1 testis is at the left lung base as well. No pleural effusion is seen. The heart is within normal limits in size. The bones are  unremarkable. IMPRESSION: 1. Progression of known left lower lobe malignancy with apparent bilateral lung metastases right more numerous than left by chest x-ray. 2. No pneumonia or effusion is seen. Electronically Signed   By: Ivar Drape M.D.   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 intake.  1.  Acute kidney injury-secondary to relative hypotension, dehydration. - We will gently hydrate the patient with IV fluids, follow BUN and creatinine. -Renal dose meds, avoid nephrotoxins.  2.  Hypernatremia-secondary to dehydration and poor p.o. intake. - We will give the patient D5W, follow sodium.  3.  suspected sepsis-patient meets criteria given his elevated lactic acid, leukocytosis and relative hypotension. -No clear source has been identified.  Patient's chest x-ray is negative for pneumonia, urinalysis is negative.  Patient has received empiric IV antibiotics in the ER.   -I think the patient's lactic acid is elevated due to his chronic liver disease and metastatic carcinoma, hypotension is also secondary to hypovolemia and underlying malignancy, no  clear evidence of infection noted.  Hold off on initiating further antibiotics. -Follow cultures, follow hemodynamics.  4.  Metastatic carcinoma-patient is suspected to have cholangiocarcinoma/hepatoma with mets to the lung.  Recently had a biopsy but tissue was necrotic and therefore not completely diagnostic. -Patient is followed by Dr. Mike Gip.  I will consult oncology.  Patient has a very poor prognosis, will also get palliative care consult to discuss goals of care.  5.  Adult failure to thrive-secondary to underlying malignancy.  Will place on Ensure supplements, continue  Marinol. - Await further palliative care input regarding goals of care.  6.  Anemia of chronic disease-secondary to underlying malignancy. -No acute need for transfusion.  Follow hemoglobin.    All the records are reviewed and case discussed with ED provider. Management plans discussed with the patient, family and they are in agreement.  CODE STATUS: Full code  TOTAL TIME TAKING CARE OF THIS PATIENT: 45 minutes.    Henreitta Leber M.D on 02/11/2018 at 1:24 PM  Between 7am to 6pm - Pager - [phone]  After 6pm go to [email or web address] - password [email or web address] [facility]  Office  [phone]  CC: Primary care physician; Patient, No Pcp Per

## 2018-02-11 NOTE — Consult Note (Signed)
[facility]  Telephone:([phone] [phone]   Name: Shane Russo Date: 02/11/2018 MRN: [id number]  DOB: 15-Aug-1945  Patient Care Team: Patient, No Pcp Per as PCP - General (General Practice) Spaete, Lenetta Quaker, MD as Consulting Physician (Gastroenterology)    REASON FOR CONSULTATION: Palliative Care consult requested for this 72 y.o. 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  PMH also notable for tobacco abuse, hypothyroidism, a OCD, history of alcohol abuse.  He underwent recent biopsy but results were inconclusive of etiology of the tumor due to large amount of necrosis.  Patient was noted to have poor oral intake with rapid weight loss and declining performance status.  There was discussion about further work-up versus transition to hospice care.  Patient is now admitted on 02/11/2018 with failure to thrive and probable sepsis.  Source of sepsis is unclear but the patient is hypotensive with hypernatremia and acute renal failure.  Palliative care was consulted to help address goals.   SOCIAL HISTORY:    Patient is widowed.  He has a son who is involved in his care.  Patient formally worked as a Museum/gallery curator.  ADVANCE DIRECTIVES:  Unknown  CODE STATUS: Full code  PAST MEDICAL HISTORY: Past Medical History:  Diagnosis Date  . Anemia   . Hypertension   . Thyroid disease     PAST SURGICAL HISTORY:  Past Surgical History:  Procedure Laterality Date  . ESOPHAGOGASTRODUODENOSCOPY (EGD) WITH PROPOFOL N/A 12/29/2017   Procedure: ESOPHAGOGASTRODUODENOSCOPY (EGD) WITH PROPOFOL;  Surgeon: Lin Landsman, MD;  Location: [place];  Service: Gastroenterology;  Laterality: N/A;  . none      HEMATOLOGY/ONCOLOGY HISTORY:   No history exists.    ALLERGIES:  has No Known  Allergies.  MEDICATIONS:  Current Facility-Administered Medications  Medication Dose Route Frequency Provider Last Rate Last Dose  . 0.9 %  sodium chloride infusion  10 mL/hr Intravenous Once Merlyn Lot, MD      . Derrill Memo ON 02/12/2018] ceFEPIme (MAXIPIME) 2 g in sodium chloride 0.9 % 100 mL IVPB  2 g Intravenous Q12H Cyndee Brightly [place], [facility]      . metroNIDAZOLE (FLAGYL) IVPB 500 mg  500 mg Intravenous Q8H Merlyn Lot, MD   Stopped at 02/11/18 1318  . vancomycin (VANCOCIN) IVPB 750 mg/150 ml premix  750 mg Intravenous Q24H Cyndee Brightly M, [facility]       Current Outpatient Medications  Medication Sig Dispense Refill  . dronabinol (MARINOL) 2.5 MG capsule Take 1 capsule (2.5 mg total) by mouth 2 (two) times daily before a meal. 60 capsule 0  . folic acid (FOLVITE) 1 MG tablet Take 1 tablet (1 mg total) by mouth daily. 30 tablet 3  . levothyroxine (SYNTHROID, LEVOTHROID) 25 MCG tablet Take 25 mcg by mouth daily.  1  . multivitamin (ONE-A-DAY MEN'S) TABS tablet Take 1 tablet by mouth daily.    . ondansetron (ZOFRAN) 4 MG tablet Take 1 tablet (4 mg total) by mouth every 8 (eight) hours as needed for nausea or vomiting. 30 tablet 0    VITAL SIGNS: BP 98/62   Pulse 87   Temp (!) 95.1 F (35.1 C) (Rectal)   Resp 19   Ht 5\' 11"  (1.803 m)   Wt 140 lb 2 oz (63.6 kg)   SpO2 100%   BMI 19.54 kg/m  [employer]   02/11/18  1046  Weight: 140 lb 2 oz (63.6 kg)    Estimated body mass index is 19.54 kg/m as calculated from the following:   Height as of this encounter: 5\' 11"  (1.803 m).   Weight as of this encounter: 140 lb 2 oz (63.6 kg).  LABS: CBC:    Component Value Date/Time   WBC 24.6 (H) 02/11/2018 1138   HGB 7.6 (L) 02/11/2018 1138   HCT 25.1 (L) 02/11/2018 1138   PLT 431 (H) 02/11/2018 1138   MCV 79.7 (L) 02/11/2018 1138   NEUTROABS 21.5 (H) 02/11/2018 1138   LYMPHSABS 1.1 02/11/2018 1138   MONOABS 1.4 (H) 02/11/2018 1138   EOSABS 0.1 02/11/2018 1138   BASOSABS 0.1  02/11/2018 1138   Comprehensive Metabolic Panel:    Component Value Date/Time   NA 149 (H) 02/11/2018 1138   K 3.6 02/11/2018 1138   CL 112 (H) 02/11/2018 1138   CO2 20 (L) 02/11/2018 1138   BUN 37 (H) 02/11/2018 1138   CREATININE 1.82 (H) 02/11/2018 1138   GLUCOSE 84 02/11/2018 1138   CALCIUM 10.2 02/11/2018 1138   AST 105 (H) 02/11/2018 1138   ALT 29 02/11/2018 1138   ALKPHOS 365 (H) 02/11/2018 1138   BILITOT 7.0 (H) 02/11/2018 1138   PROT 7.4 02/11/2018 1138   ALBUMIN 2.6 (L) 02/11/2018 1138    RADIOGRAPHIC STUDIES: Dg Chest Portable 1 View  Result Date: 02/11/2018 CLINICAL DATA:  Failure to thrive, loss of 50 pounds over the last year, history of lung carcinoma EXAM: PORTABLE CHEST 1 VIEW COMPARISON:  PET-CT of 12/25/2017 and chest x-ray of 12/14/2017 FINDINGS: The left lower lobe mass appears to have increased in size consistent with progression of lung carcinoma. Also there are several nodular opacities in the right lower lobe most consistent with metastases contra laterally with a probable 1 testis is at the left lung base as well. No pleural effusion is seen. The heart is within normal limits in size. The bones are unremarkable. IMPRESSION: 1. Progression of known left lower lobe malignancy with apparent bilateral lung metastases right more numerous than left by chest x-ray. 2. No pneumonia or effusion is seen. Electronically Signed   By: Ivar Drape M.D.   On: 02/11/2018 12:14    PERFORMANCE STATUS (ECOG) : 4 - Bedbound  Review of Systems As noted above. Otherwise, a complete review of systems is negative.  Physical Exam General: Ill-appearing, thin Cardiovascular: regular rate and rhythm Pulmonary: clear ant fields Abdomen: soft, nontender, + bowel sounds Extremities: no edema Skin: no rashes Neurological: Alert but confused, patient is difficult to understand  IMPRESSION: Patient seen in the ER.  Case discussed with Dr. Verdell Carmine and ER nurse.  Patient is currently  confused with poor vocal quality making it difficult to understand his answers to questions.  He is pending admission to hospital.  No family present.  I attempted to call patient's son without success.  It appears that the number listed in the chart is incorrect.  I have asked the nurse to notify me upon arrival of patient's son.  PLAN: Continue medical treatment We will try to reach family to discuss goals   Time Total: 50 minutes  Visit consisted of counseling and education dealing with the complex and emotionally intense issues of symptom management and palliative care in the setting of serious and potentially life-threatening illness.Greater than 50%  of this time was spent counseling and coordinating care related to the above assessment and plan.  Signed by: Altha Harm,  PhD, NP-C [phone] (Work Cell)

## 2018-02-11 NOTE — ED Provider Notes (Signed)
[facility] Emergency Department Provider Note    First MD Initiated Contact with Patient 02/11/18 1108     (approximate)  I have reviewed the triage vital signs and the nursing notes.   HISTORY  Chief Complaint Failure To Thrive  Level V Caveat:  AMS   HPI Shane Russo is a 72 y.o. 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  No report of any pain but is brought in by son after checking on him find the patient very frail.  He lives at home alone.  Denies any report of any pain.  No vomiting.  No measured fevers at home.  Patient unable to provide any history due to encephalopathy.    Past Medical History:  Diagnosis Date  . Anemia   . Hypertension   . Thyroid disease    History reviewed. No pertinent family history. Past Surgical History:  Procedure Laterality Date  . ESOPHAGOGASTRODUODENOSCOPY (EGD) WITH PROPOFOL N/A 12/29/2017   Procedure: ESOPHAGOGASTRODUODENOSCOPY (EGD) WITH PROPOFOL;  Surgeon: Lin Landsman, MD;  Location: [place];  Service: Gastroenterology;  Laterality: N/A;  . none     Patient Active Problem List   Diagnosis Date Noted  . Goals of care, counseling/discussion 01/28/2018  . Anemia 12/20/2017  . Metastatic adenocarcinoma ([place]) 12/20/2017  . Sepsis ([place]) 12/14/2017      Prior to Admission medications   Medication Sig Start Date End Date Taking? Authorizing Provider  dronabinol (MARINOL) 2.5 MG capsule Take 1 capsule (2.5 mg total) by mouth 2 (two) times daily before a meal. 01/28/18  Yes Karen Kitchens, NP  folic acid (FOLVITE) 1 MG tablet Take 1 tablet (1 mg total) by mouth daily. 01/28/18  Yes Karen Kitchens, NP  levothyroxine (SYNTHROID, LEVOTHROID) 25 MCG tablet Take 25 mcg by mouth daily. 11/20/17  Yes [provider]  multivitamin (ONE-A-DAY MEN'S) TABS tablet  Take 1 tablet by mouth daily.   Yes [provider]  ondansetron (ZOFRAN) 4 MG tablet Take 1 tablet (4 mg total) by mouth every 8 (eight) hours as needed for nausea or vomiting. 01/28/18  Yes Karen Kitchens, NP    Allergies Patient has no known allergies.    Social History Social History   Tobacco Use  . Smoking status: Current Every Day Smoker    Packs/day: 0.50    Years: 50.00    Pack years: 25.00    Types: Cigarettes  . Smokeless tobacco: Current User    Types: Chew  Substance Use Topics  . Alcohol use: Yes    Alcohol/week: 7.0 standard drinks    Types: 7 Shots of liquor per week    Frequency: Never    Comment: a little gin once a day pt stated   . Drug use: Never    Review of Systems Patient denies headaches, rhinorrhea, blurry vision, numbness, shortness of breath, chest pain, edema, cough, abdominal pain, nausea, vomiting, diarrhea, dysuria, fevers, rashes or hallucinations unless otherwise stated above in HPI. ____________________________________________   PHYSICAL EXAM:  VITAL SIGNS: Vitals:   02/11/18 1120 02/11/18 1230  BP: (!) 78/57 90/66  Pulse:    Resp:  16  Temp:    SpO2:      Constitutional: Alert, frail appearing, cachectic, scleral icterus Eyes: Conjunctivae are normal.  Head: Atraumatic. Nose: No congestion/rhinnorhea. Mouth/Throat: Mucous membranes are moist.   Neck: No  stridor. Painless ROM.  Cardiovascular: Normal rate, regular rhythm. Grossly normal heart sounds.  Good peripheral circulation. Respiratory: Normal respiratory effort.  No retractions. Lungs CTAB. Gastrointestinal: Soft and nontender. No distention. No abdominal bruits. No CVA tenderness. Genitourinary:  Musculoskeletal: No lower extremity tenderness nor edema.  No joint effusions. Neurologic:  Encephalopathic, no facial droop, MAE spontaneously Skin:  Skin is warm, dry and intact. No rash noted. Psychiatric:unable to  assess ____________________________________________   LABS (all labs ordered are listed, but only abnormal results are displayed)  Results for orders placed or performed during the hospital encounter of 02/11/18 (from the past 24 hour(s))  CBC with Differential/Platelet     Status: Abnormal   Collection Time: 02/11/18 11:38 AM  Result Value Ref Range   WBC 24.6 (H) 4.0 - 10.5 K/uL   RBC 3.15 (L) 4.22 - 5.81 MIL/uL   Hemoglobin 7.6 (L) 13.0 - 17.0 g/dL   HCT 25.1 (L) 39.0 - 52.0 %   MCV 79.7 (L) 80.0 - 100.0 fL   MCH 24.1 (L) 26.0 - 34.0 pg   MCHC 30.3 30.0 - 36.0 g/dL   RDW 21.3 (H) 11.5 - 15.5 %   Platelets 431 (H) 150 - 400 K/uL   nRBC 0.2 0.0 - 0.2 %   Neutrophils Relative % 88 %   Neutro Abs 21.5 (H) 1.7 - 7.7 K/uL   Lymphocytes Relative 5 %   Lymphs Abs 1.1 0.7 - 4.0 K/uL   Monocytes Relative 6 %   Monocytes Absolute 1.4 (H) 0.1 - 1.0 K/uL   Eosinophils Relative 0 %   Eosinophils Absolute 0.1 0.0 - 0.5 K/uL   Basophils Relative 0 %   Basophils Absolute 0.1 0.0 - 0.1 K/uL   WBC Morphology MORPHOLOGY UNREMARKABLE    Immature Granulocytes 1 %   Abs Immature Granulocytes 0.29 (H) 0.00 - 0.07 K/uL   Target Cells PRESENT   Comprehensive metabolic panel     Status: Abnormal   Collection Time: 02/11/18 11:38 AM  Result Value Ref Range   Sodium 149 (H) 135 - 145 mmol/L   Potassium 3.6 3.5 - 5.1 mmol/L   Chloride 112 (H) 98 - 111 mmol/L   CO2 20 (L) 22 - 32 mmol/L   Glucose, Bld 84 70 - 99 mg/dL   BUN 37 (H) 8 - 23 mg/dL   Creatinine, Ser 1.82 (H) 0.61 - 1.24 mg/dL   Calcium 10.2 8.9 - 10.3 mg/dL   Total Protein 7.4 6.5 - 8.1 g/dL   Albumin 2.6 (L) 3.5 - 5.0 g/dL   AST 105 (H) 15 - 41 U/L   ALT 29 0 - 44 U/L   Alkaline Phosphatase 365 (H) 38 - 126 U/L   Total Bilirubin 7.0 (H) 0.3 - 1.2 mg/dL   GFR calc non Af Amer 36 (L) >60 mL/min   GFR calc Af Amer 42 (L) >60 mL/min   Anion gap 17 (H) 5 - 15  Protime-INR     Status: Abnormal   Collection Time: 02/11/18 11:38 AM   Result Value Ref Range   Prothrombin Time 15.8 (H) 11.4 - 15.2 seconds   INR 1.27   Type and screen [facility]     Status: None (Preliminary result)   Collection Time: 02/11/18 11:38 AM  Result Value Ref Range   ABO/RH(D) PENDING    Antibody Screen PENDING    Sample Expiration      02/14/2018 Performed at [facility], [address] [place], [place] [address]  Lactic acid, plasma     Status: Abnormal   Collection Time: 02/11/18 11:38 AM  Result Value Ref Range   Lactic Acid, Venous 4.9 (HH) 0.5 - 1.9 mmol/L  Ammonia     Status: None   Collection Time: 02/11/18 11:38 AM  Result Value Ref Range   Ammonia 28 9 - 35 umol/L  Prepare RBC     Status: None (Preliminary result)   Collection Time: 02/11/18 12:30 PM  Result Value Ref Range   Order Confirmation PENDING    ____________________________________________  EKG My review and personal interpretation at Time: 10:52   Indication: hypotension  Rate: 105  Rhythm: sinus Axis: normal Other: nonspecific st abn, abn ekg ____________________________________________  RADIOLOGY  I personally reviewed all radiographic images ordered to evaluate for the above acute complaints and reviewed radiology reports and findings.  These findings were personally discussed with the patient.  Please see medical record for radiology report.  ____________________________________________   PROCEDURES  Procedure(s) performed:  .Critical Care Performed by: Merlyn Lot, MD Authorized by: Merlyn Lot, MD   Critical care provider statement:    Critical care time (minutes):  35   Critical care time was exclusive of:  Separately billable procedures and treating other patients   Critical care was necessary to treat or prevent imminent or life-threatening deterioration of the following conditions:  Sepsis   Critical care was time spent personally by me on the following activities:  Development of treatment  plan with patient or surrogate, discussions with consultants, evaluation of patient's response to treatment, examination of patient, obtaining history from patient or surrogate, ordering and performing treatments and interventions, ordering and review of laboratory studies, ordering and review of radiographic studies, pulse oximetry, re-evaluation of patient's condition and review of old charts      Critical Care performed: yes ____________________________________________   INITIAL IMPRESSION / [place] / ED COURSE  Pertinent labs & imaging results that were available during my care of the patient were reviewed by me and considered in my medical decision making (see chart for details).   DDX: Dehydration, sepsis, pna, uti, hypoglycemia, cva, drug effect,   Maude Gloor is a 72 y.o. who presents to the ED with extensive past medical history presents the ER for symptoms as described above.  Patient found to be hypothermic, tachycardic.  No respiratory distress.  Does appear mildly encephalopathic, likely secondary to metabolic derangement.The patient will be placed on continuous pulse oximetry and telemetry for monitoring.  Laboratory evaluation will be sent to evaluate for the above complaints.     Clinical Course as of Feb 11 1237  Thu Feb 11, 2018  1219 Patient with low hemoglobin and given his hypotension will transfuse as he also has low albumin given his cirrhosis.  Has acutely elevated sodium with high anion gap metabolic acidosis.  No evidence of DKA.  Patient receiving IV fluid resuscitation.  Starting IV antibiotics as he does meet criteria for sepsis.  Uncertain source at this point.  May be secondary to profound dehydration causing his Sirs.   [PR]    Clinical Course User Index [PR] Merlyn Lot, MD     As part of my medical decision making, I reviewed the following data within the [place] notes reviewed and incorporated, Labs reviewed,  notes from prior ED visits.  ____________________________________________   FINAL CLINICAL IMPRESSION(S) / ED DIAGNOSES  Final diagnoses:  Sepsis, due to unspecified organism, unspecified whether acute organ dysfunction present [facility])  Malignant  neoplasm of liver, unspecified liver malignancy type ([place])      NEW MEDICATIONS STARTED DURING THIS VISIT:  New Prescriptions   No medications on file     Note:  This document was prepared using Dragon voice recognition software and may include unintentional dictation errors.    Merlyn Lot, MD 02/11/18 [phone]

## 2018-02-11 NOTE — ED Notes (Signed)
Lab called at this time for blood draw.

## 2018-02-11 NOTE — ED Triage Notes (Signed)
Pt presents from home via [facility] with c/o failure to thrive. Pt's family reported to ems that within the last year, pt has lost 50 pounds. Two months ago, pt was diagnosed with lung cancer. Since the diagnosis, pt's family states that pt will not eat or drink anything. Pt is not receiving tx for cancer. Blood sugar 98 for ems.

## 2018-02-11 NOTE — ED Notes (Signed)
Lab at bedside

## 2018-02-11 NOTE — Consult Note (Signed)
Name: Shane Russo MRN: [id number] DOB: [birth year]    ADMISSION DATE:  02/11/2018 CONSULTATION DATE: 02/11/2018  REFERRING MD : Dr. 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 He presented to [facility] ER on 12/5 via EMS with failure to thrive.  Per ER notes within the past year the pt has had unintentional weight loss of 50 lbs.  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  He underwent recent biopsy, however results inconclusive of etiology of the tumor due to large amount of necrosis. Per Palliative Care there was discussion regarding further workup versus transition to hospice care.  In the ER lab results revealed Na+ 149, chloride 112, CO2 20, BUN 37, creatinine 1.82, anion gap 17, alk phos 365, albumin 2.6, AST 105, total bilirubin 7.0,lactic acid 4.9, wbc 24.6, hgb 7.6, PCT 14.4, and UA negative.  CXR revealed progression of known left lower lobe malignancy with bilateral lung metastases, no pneumonia or effusion present. He was hypotensive bp 87/62 and hypothermic temp 94.8 degrees.  He ruled in for sepsis although etiology unknown, therefore  2L NS bolus and iv abx.  He was subsequently admitted to the stepdown unit by hospitalist team for additional workup and treatment.    PAST MEDICAL HISTORY :   has a past medical history of Anemia, Hypertension, and Thyroid disease.  has a past surgical history that includes none and  Esophagogastroduodenoscopy (egd) with propofol (N/A, 12/29/2017). Prior to Admission medications   Medication Sig Start Date End Date Taking? Authorizing Provider  dronabinol (MARINOL) 2.5 MG capsule Take 1 capsule (2.5 mg total) by mouth 2 (two) times daily before a meal. 01/28/18  Yes Karen Kitchens, NP  folic acid (FOLVITE) 1 MG tablet Take 1 tablet (1 mg total) by mouth daily. 01/28/18  Yes Karen Kitchens, NP  levothyroxine (SYNTHROID, LEVOTHROID) 25 MCG tablet Take 25 mcg by mouth daily. 11/20/17  Yes [provider]  multivitamin (ONE-A-DAY MEN'S) TABS tablet Take 1 tablet by mouth daily.   Yes [provider]  ondansetron (ZOFRAN) 4 MG tablet Take 1 tablet (4 mg total) by mouth every 8 (eight) hours as needed for nausea or vomiting. 01/28/18  Yes Karen Kitchens, NP   No Known Allergies  FAMILY HISTORY:  family history is not on file. SOCIAL HISTORY:  reports that he has been smoking cigarettes. He has a 25.00 pack-year smoking history. His smokeless tobacco use includes chew. He reports that he drinks about 7.0 standard drinks of alcohol per week. He reports that he does not use drugs.  REVIEW OF SYSTEMS:   Unable to assess pt confused   SUBJECTIVE:  Pt confused no complaints at this time   VITAL SIGNS: Temp:  [94.8 F (34.9 C)-97.3 F (36.3 C)] 97.3 F (36.3 C) (12/05 1700) Pulse Rate:  [47-101] 95 (12/05 1635) Resp:  [14-22] 19 (12/05 1635) BP: (78-98)/(50-66) 87/50 (12/05 1700) SpO2:  [95 %-100 %] 95 % (  12/05 1700) Weight:  [63.6 kg] 63.6 kg (12/05 1046)  PHYSICAL EXAMINATION: General: chronically ill cachetic appearing male, NAD  Neuro: alert to self only, follows commands, PERRL, sclera jaundiced  HEENT: supple, no JVD  Cardiovascular: nsr, rrr, no R/G  Lungs: clear throughout, even, non labored  Abdomen: hypoactive BS x4, concaved, soft non distended, non tender   Musculoskeletal: cachetic and frail, moves all extremities  Skin: no rashes or lesions  present   Recent Labs  Lab 02/11/18 1138  NA 149*  K 3.6  CL 112*  CO2 20*  BUN 37*  CREATININE 1.82*  GLUCOSE 84   Recent Labs  Lab 02/11/18 1138  HGB 7.6*  HCT 25.1*  WBC 24.6*  PLT 431*   Dg Chest Portable 1 View  Result Date: 02/11/2018 CLINICAL DATA:  Failure to thrive, loss of 50 pounds over the last year, history of lung carcinoma EXAM: PORTABLE CHEST 1 VIEW COMPARISON:  PET-CT of 12/25/2017 and chest x-ray of 12/14/2017 FINDINGS: The left lower lobe mass appears to have increased in size consistent with progression of lung carcinoma. Also there are several nodular opacities in the right lower lobe most consistent with metastases contra laterally with a probable 1 testis is at the left lung base as well. No pleural effusion is seen. The heart is within normal limits in size. The bones are unremarkable. IMPRESSION: 1. Progression of known left lower lobe malignancy with apparent bilateral lung metastases right more numerous than left by chest x-ray. 2. No pneumonia or effusion is seen. Electronically Signed   By: Ivar Drape M.D.   On: 02/11/2018 12:14    ASSESSMENT / PLAN:  Hypotension secondary to dehydration and sepsis although source of infection unknown Continuous telemetry monitoring  Maintain map >65 with aggressive fluid resuscitation if remains hypotensive will start neo-synephrine D5W '@75'$  ml/hr   Trend wbc and monitor fever curve  Trend PCT and lactic acid  Follow cultures  Will stop cefepime and start zosyn for potential intraabdominal infection, continue vancomycin for now if MRSA PCR negative will discontinue vancomycin   Acute renal failure likely secondary to hypotension  Trend BMP  Replace electrolytes as indicated  Monitor UOP  Avoid nephrotoxic medications   Anemia of chronic disease  VTE px: subq heparin  Trend CBC  Monitor for s/sx of bleeding and transfuse for hgb <7  Recent dx 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

## 2018-02-11 NOTE — Progress Notes (Deleted)
Symptom Management [place]  [phone] [phone]  Patient Care Team: Center, [facility] as PCP - General (General Practice) Shane Russo, Lenetta Quaker, MD as Consulting Physician (Gastroenterology)   Name of the patient: Shane Russo  [id number]  1945/06/03   Date of visit: 02/11/18  Diagnosis- Metastatic Adenocarcinoma  Chief complaint/ Reason for visit- Fatigue  Heme/Onc history:  Shane Russo, 72 year old male with metastatic adenocarcinoma. Primary medical-oncologist is Dr. Mike Russo.   Patient initially presented as a 72 year old male who is seen for assessment after hospitalization. At that time, he reported 22 lb weight loss over past year.  He presented to [facility] ER on 12/14/17.  CBC revealed hct 29.2, hmg 9.1, plt 574, wbc 18.2, lactic acid 3.7.  Chest x-ray revealed 4.5 cm mass in the superior segment of the left lower lobe and multiple smaller bilateral lung nodules. 12/14/2017 abdomen and pelvic CT revealed multiple liver lesions consistent with liver metastasis.  Multiple lung nodules at lung bases.  Small amount of ascites. He was admitted to [facility] 12/14/17-12/16/17.  Empirically started on broad-spectrum antibiotics.  Blood cultures were negative. Hemoglobin decreased to 6.9 and he required blood transfusion.  Anemia work-up included: B12 (55), folate (6.4), ferritin (1326), iron saturation (17%), TIBC (134), reticulocyte (2.6%).   Chest CT on 12/15/2017 revealed 4.8 cm solid central left lower lobe lung mass consistent with primary bronchogenic carcinoma.  There were > 20 solid pulmonary nodules of various sizes scattered throughout both lungs.  No thoracic adenopathy.  Trace dependent right pleural effusion.  There was an indeterminate right adrenal nodule suspicious for adrenal metastasis.  CEA was 444.  AFP was 9.0  Ultrasound-guided liver biopsy on 12/16/2017 revealed: Adenocarcinoma with extracellular mucin and extensive  necrosis.  Cytokeratin 7 and was positive.  Cytokeratin 20 TTF-1, Napsin, and CDX2 were negative.  Differential diagnosis included primary hepatic cholangiocarcinoma, adenocarcinoma of the pancreas, gallbladder, or upper GI.  There was insufficient tissue for ancillary molecular testing.  PET scan on 12/25/2017 revealed: Metastatic disease to the liver, lungs, right adrenal gland, and possibly a porta hepatis lymph node.  Favored primary site with a dominant mass in the left lower lobe.  The areas of more diffuse hypermetabolism including within lung lesions and posterior right hepatic lobe mass.  Trace pleural fluid.  Small volume pelvic ascites.  Upper endoscopy by Dr. Marius Russo on 12/29/2017 revealed: Medium sized infiltrative mass possibly representative of liver tumor eroding duodenal bulb.  Pathology revealed: Duodenal mucosa with benign Shane Russo gland proliferative lesion, negative for dysplasia or malignancy.  Upper endoscopic ultrasound by Dr. Cephas Russo on 01/21/2018. There was extrinsic compression of the gastric antrum leading to significant deformity.  There was abnormal appearing, infiltrative and friable mucosa in the duodenal bulb concerning for local tumor infiltration.  Pancreatic parenchymal abnormalities consisting of hypoechoic strands and lobularity were noted in the pancreatic neck, body, and tail.  The pancreatic head was not well visualized due to extrinsic compression within the gastric antrum. The pancreatic duct had a normal endoscopic appearance in the body and tail of the pancreas.  One malignant appearing lymph node was found in the left lobe of the liver.  Two metastatic lesions were found in the left lobe of the liver.  Pathology: Adenocarcinoma with abundant necrosis.  IHC stains were attempted on block A1 however, deeper sections contain mostly necrotic debris with essentially no viable tumor for evaluation.  CK7 shows positive expression and predominantly necrotic tissue.  Findings  are nonspecific  for a particular site of origin.  He has a 25-pack-year smoking history and smokeless tobacco use.  Approximately 7 drinks of alcohol per week.  Denies drug use.  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    Interval history-    Patient was referred to nutrition for visit and no showed.  Patient is followed by GI nurse navigator, Shane Clonts, RN.   MRI brain scheduled for 02/14/2018.  Second Biopsy? Dr. Rocket Gunderson Russo?  Denies any neurologic complaints. Denies recent fevers or illnesses. Denies any easy bleeding or bruising. Reports good appetite and denies weight loss. Denies chest pain. Denies any nausea, vomiting, constipation, or diarrhea. Denies urinary complaints. Patient offers no further specific complaints today.  ECOG FS:{CHL ONC OE:[id number]}  Review of systems- ROS   Current treatment- ***  No Known Allergies  Past Medical History:  Diagnosis Date  . Anemia   . Hypertension   . Thyroid disease     Past Surgical History:  Procedure Laterality Date  . ESOPHAGOGASTRODUODENOSCOPY (EGD) WITH PROPOFOL N/A 12/29/2017   Procedure: ESOPHAGOGASTRODUODENOSCOPY (EGD) WITH PROPOFOL;  Surgeon: Shane Landsman, MD;  Location: [place];  Service: Gastroenterology;  Laterality: N/A;  . none      Social History   Socioeconomic History  . Marital status: Widowed    Spouse name: Not on file  . Number of children: Not on file  . Years of education: Not on file  . Highest education level: Not on file  Occupational History  . Occupation: retired  Scientific laboratory technician  . Financial resource strain: Not on file  . Food insecurity:    Worry: Not on file    Inability: Not on file  . Transportation needs:    Medical: Not on file    Non-medical: Not on file  Tobacco Use  . Smoking status: Current Every Day Smoker    Packs/day: 0.50    Years: 50.00    Pack years: 25.00    Types: Cigarettes  . Smokeless tobacco: Current User    Types: Chew    Substance and Sexual Activity  . Alcohol use: Yes    Alcohol/week: 7.0 standard drinks    Types: 7 Shots of liquor per week    Frequency: Never    Comment: a little gin once a day pt stated   . Drug use: Never  . Sexual activity: Not Currently  Lifestyle  . Physical activity:    Days per week: Not on file    Minutes per session: Not on file  . Stress: Not on file  Relationships  . Social connections:    Talks on phone: Not on file    Gets together: Not on file    Attends religious service: Not on file    Active member of club or organization: Not on file    Attends meetings of clubs or organizations: Not on file    Relationship status: Not on file  . Intimate partner violence:    Fear of current or ex partner: Not on file    Emotionally abused: Not on file    Physically abused: Not on file    Forced sexual activity: Not on file  Other Topics Concern  . Not on file  Social History Narrative  . Not on file    No family history on file.   Current Outpatient Medications:  .  dronabinol (MARINOL) 2.5 MG capsule, Take 1 capsule (2.5 mg total) by mouth 2 (two) times daily before  a meal., Disp: 60 capsule, Rfl: 0 .  folic acid (FOLVITE) 1 MG tablet, Take 1 tablet (1 mg total) by mouth daily., Disp: 30 tablet, Rfl: 3 .  levothyroxine (SYNTHROID, LEVOTHROID) 25 MCG tablet, Take 25 mcg by mouth daily., Disp: , Rfl: 1 .  multivitamin (ONE-A-DAY MEN'S) TABS tablet, Take 1 tablet by mouth daily., Disp: , Rfl:  .  ondansetron (ZOFRAN) 4 MG tablet, Take 1 tablet (4 mg total) by mouth every 8 (eight) hours as needed for nausea or vomiting., Disp: 30 tablet, Rfl: 0  Physical exam: There were no vitals filed for this visit. Physical Exam   CMP Latest Ref Rng & Units 12/16/2017  Glucose 70 - 99 mg/dL 115(H)  BUN 8 - 23 mg/dL 6(L)  Creatinine 0.61 - 1.24 mg/dL 0.64  Sodium 135 - 145 mmol/L 134(L)  Potassium 3.5 - 5.1 mmol/L 3.2(L)  Chloride 98 - 111 mmol/L 100  CO2 22 - 32 mmol/L 23   Calcium 8.9 - 10.3 mg/dL 8.2(L)  Total Protein 6.5 - 8.1 g/dL -  Total Bilirubin 0.3 - 1.2 mg/dL -  Alkaline Phos 38 - 126 U/L -  AST 15 - 41 U/L -  ALT 0 - 44 U/L -   CBC Latest Ref Rng & Units 12/16/2017  WBC 4.0 - 10.5 K/uL 18.4(H)  Hemoglobin 13.0 - 17.0 g/dL 9.1(L)  Hematocrit 39.0 - 52.0 % 27.9(L)  Platelets 150 - 400 K/uL 495(H)    No images are attached to the encounter.  No results found.  Assessment and plan- Patient is a 72 y.o. male ***   Visit Diagnosis No diagnosis found.  Patient expressed understanding and was in agreement with this plan. He also understands that He can call clinic at any time with any questions, concerns, or complaints.   Thank you for allowing me to participate in the care of this very pleasant patient.   Beckey Rutter, DNP, AGNP-C [place] at [facility] [phone] (work cell) [phone] (office)

## 2018-02-11 NOTE — Consult Note (Signed)
CODE SEPSIS - PHARMACY COMMUNICATION  **Broad Spectrum Antibiotics should be administered within 1 hour of Sepsis diagnosis**  Time Code Sepsis Called/Page Received: 1156  Antibiotics Ordered: Cefepime + Vancomycin + Flagyl  Time of 1st antibiotic administration: 1215  Additional action taken by pharmacy: N/A  If necessary, Name of Provider/Nurse Contacted: [place], PharmD Pharmacy Resident  02/11/2018 11:56 AM

## 2018-02-11 NOTE — ED Notes (Signed)
Son attempted to contact at this time by this RN with no answer.

## 2018-02-11 NOTE — Progress Notes (Addendum)
Called by ER nurse that patient is still quite hypotensive with SBP in the 70's.  He is clinically asymptomatic.   Will given 1 L IV fluid bolus and also admit to stepdown instead of med-surg.

## 2018-02-11 NOTE — Consult Note (Addendum)
Pharmacy Antibiotic Note  Shane Russo is a 72 y.o. male admitted on 02/11/2018 with sepsis. Patient has PMH of stage IV metastatic liver cancer not currently on chemo. Baseline Scr ~0.7. Pharmacy has been consulted for Zosyn + Vancomycin dosing.  Plan: Ordered Zosyn 3.375 g IV q8h (EI). Will order Vancomycin 750 mg IV q24h starting at 2200. Will order trough prior to 4th dose on 12/6 @ 2130. Goal trough 15-20.   Per CCM discussion, will discontinue vancomycin if MRSA PCR (-) to reduce potential for nephrotoxicity.  Vancomycin Kinetics Using Actual BW 63.6 kg and CrCl 33 mL/min ke 0.032 Vd 44.5 T1/2 21.7 hrs  Height: 5\' 11"  (180.3 cm) Weight: 140 lb 2 oz (63.6 kg) IBW/kg (Calculated) : 75.3  Temp (24hrs), Avg:94.8 F (34.9 C), Min:94.8 F (34.9 C), Max:94.8 F (34.9 C)  Recent Labs  Lab 02/11/18 1138  WBC 24.6*  CREATININE 1.82*    Estimated Creatinine Clearance: 33 mL/min (A) (by C-G formula based on SCr of 1.82 mg/dL (H)).    No Known Allergies  Antimicrobials this admission: 12/5 Cefepime x1 12/5 Zosyn >>  12/5 Vancomycin >>  12/5 Metronidazole x 1  Dose adjustments this admission: N/A  Microbiology results: 12/5 BCx: sent 12/5 UCx: sent  12/5 MRSA PCR (-)   Thank you for allowing pharmacy to be a part of this patient's care.   Paticia Stack, PharmD Pharmacy Resident  02/11/2018 12:30 PM

## 2018-02-11 NOTE — ED Notes (Signed)
Pallative care MD at bedside

## 2018-02-11 NOTE — ED Notes (Signed)
Pt placed on 2L via [place] for comfort and due to being unable to get consistent O2 saturation, discussed with Dr. Quentin Cornwall. Lab at bedside to draw ABO and remaining lab work due to patient being a difficult stick.

## 2018-02-11 NOTE — Consult Note (Signed)
[facility]  Date of admission:  02/11/2018  Inpatient day:  02/11/2018  Consulting physician: Dr. Abel Presto.  Reason for Consultation:  Metastatic carcinoma; Failure to thrive.  Chief Complaint: Shane Russo is a 72 y.o. 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  HPI:   The patient initially presented with generalized weakness and weight loss.  Imaging revealed a 4.5 cm mass in the superior segment of the left lower lobe and > 20 small bilateral lung nodules.  There were multiple liver lesions c/w metastasis.  The largest lesion was 9.4 x 9.8 cm.  He underwent liver biopsy on 12/16/2017.  The majority of the sample was necrotic.  Pathology revealed adenocarcinoma with extracellular mucin and extensive necrosis.  Cytokeratin 7 was positive.  Cytokeratin 20, TTF-1, napsin, and CDX-2 was negative.  Differential diagnosis included primary hepatic cholangiocarcinoma; adenocarcinoma of the pancreas, gallbladder or upper GI (stomach, distal esophagus). There was insufficient tissue for ancillary molecular testing.  CEA was 444 and AFP was 9.0 (0-8.3) on 12/15/2017.  He subsequently underwent additional procedures to determine the site of origin and obtain additional tissue for testing.  PET scan revealed metastatic disease to the liver, lungs, right adrenal gland, and possibly a porta hepatis lymph node. The favored primary site was a dominant mass in the left lower lobe. The areas of more diffuse hypermetabolism, including within lung lesions and the posterior right hepatic lobe mass, would be amendable to highest yield sampling.  There was trace right pleural fluid and small volume pelvic ascites.  He underwent upper endoscopy to assess for a primary lesion. Upper endoscopy followed by endoscopic ultrasound revealed  extrinsic compression of the gastric antrum and duodenum.  There was no apparent pancreatic mass.  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  He was last seen in the medical oncology clinic on 01/28/2018.  He was undecided about treatment.  His son wanted him to pursue therapy.  He was thus scheduled for port placement, chemotherapy class, head MRI, and evaluation by nutrition.  I spoke with the patient's son on the phone before Thanksgiving regarding the patient's thoughts about therapy.  He was to get follow-up on 02/09/2018.  Appointments were cancelled by the patient.  The patient's son notes that he has continued to become weaker.  He has lost additional weight secondary to poor oral intake.  He needs assistance with some of his ADLs.  The patient denies pain.  He denies any nausea or vomiting.  He denies any fever.   Past Medical History:  Diagnosis Date  . Anemia   . Hypertension   . Thyroid disease     Past Surgical History:  Procedure Laterality Date  . ESOPHAGOGASTRODUODENOSCOPY (EGD) WITH PROPOFOL N/A 12/29/2017   Procedure: ESOPHAGOGASTRODUODENOSCOPY (EGD) WITH PROPOFOL;  Surgeon: Lin Landsman, MD;  Location: [place];  Service: Gastroenterology;  Laterality: N/A;  . none      History reviewed. No pertinent family history.  Social History:  reports that he has been smoking cigarettes. He has a 25.00 pack-year smoking history. His smokeless tobacco use includes chew. He reports that he drinks about 7.0 standard drinks of alcohol per week. He reports that he does not use drugs.  The patient was initially alone in the ICU.  He was later accompanied by his son, Shane Russo.  His son Shane Harrington does not have medical power of  attorney.  The patient has another son, Shane Russo.  Allergies: No Known Allergies  Medications Prior to Admission  Medication Sig Dispense Refill  . dronabinol (MARINOL) 2.5 MG capsule Take 1 capsule (2.5 mg total) by mouth 2 (two) times  daily before a meal. 60 capsule 0  . folic acid (FOLVITE) 1 MG tablet Take 1 tablet (1 mg total) by mouth daily. 30 tablet 3  . levothyroxine (SYNTHROID, LEVOTHROID) 25 MCG tablet Take 25 mcg by mouth daily.  1  . multivitamin (ONE-A-DAY MEN'S) TABS tablet Take 1 tablet by mouth daily.    . ondansetron (ZOFRAN) 4 MG tablet Take 1 tablet (4 mg total) by mouth every 8 (eight) hours as needed for nausea or vomiting. 30 tablet 0    Review of Systems: GENERAL:  Fatigue.  No fevers or sweats.  Weight loss. PERFORMANCE STATUS (ECOG):  3 HEENT:  No visual changes, runny nose, sore throat, mouth sores or tenderness. Lungs: No shortness of breath.  No cough.  No hemoptysis. Cardiac:  No chest pain, palpitations, orthopnea, or PND. GI:  Poor oral intake.  No nausea, vomiting, diarrhea, constipation, melena or hematochezia. GU:  No urgency, frequency, dysuria, or hematuria. Musculoskeletal:  No back pain.  No joint pain.  No muscle tenderness. Extremities:  No pain or swelling. Skin:  No rashes or skin changes. Neuro:  General weakness.  No headache, numbness or weakness, balance or coordination issues. Endocrine:  No diabetes, thyroid issues, hot flashes or night sweats. Psych:  No mood changes, depression or anxiety.  Hypersomnia. Pain:  No focal pain. Review of systems:  All other systems reviewed and found to be negative.  Physical Exam:  Blood pressure (!) 90/57, pulse 87, temperature 97.6 F (36.4 C), temperature source Oral, resp. rate (!) 29, height 6\' 4"  (1.93 m), weight 131 lb 6.3 oz (59.6 kg), SpO2 99 %.  GENERAL:  Thin gentleman lying comfortably in the ICU in no acute distress. MENTAL STATUS:  Alert and oriented to person and place ("[place]").  Does not know month or year. HEAD:  Near alopecia.  Temporal wasting.  Normocephalic, atraumatic, face symmetric, no Cushingoid features. EYES:  Brown eyes.  Scleral icterus.  Pupils equal round and reactive to light and accomodation. ENT:   Oropharynx clear without lesion.  Tongue normal.  Poor dentition. Mucous membranes moist.  RESPIRATORY:  Clear to auscultation anteriorly without rales, wheezes or rhonchi. CARDIOVASCULAR:  Regular rate and rhythm without murmur, rub or gallop. ABDOMEN:  Scaphoid with massive hepatomegaly.  Soft, non-tender, with active bowel sounds. SKIN:  No rashes, ulcers or lesions. EXTREMITIES: Thin.  No edema, no skin discoloration or tenderness.  No palpable cords. LYMPH NODES: No palpable cervical, supraclavicular, axillary or inguinal adenopathy  NEUROLOGICAL: Moves all 4 extremities.  Follows commands.  Answers all questions. PSYCH:  Appropriate.   Results for orders placed or performed during the hospital encounter of 02/11/18 (from the past 48 hour(s))  CBC with Differential/Platelet     Status: Abnormal   Collection Time: 02/11/18 11:38 AM  Result Value Ref Range   WBC 24.6 (H) 4.0 - 10.5 K/uL   RBC 3.15 (L) 4.22 - 5.81 MIL/uL   Hemoglobin 7.6 (L) 13.0 - 17.0 g/dL   HCT 25.1 (L) 39.0 - 52.0 %   MCV 79.7 (L) 80.0 - 100.0 fL   MCH 24.1 (L) 26.0 - 34.0 pg   MCHC 30.3 30.0 - 36.0 g/dL   RDW 21.3 (H) 11.5 - 15.5 %   Platelets 431 (  H) 150 - 400 K/uL   nRBC 0.2 0.0 - 0.2 %   Neutrophils Relative % 88 %   Neutro Abs 21.5 (H) 1.7 - 7.7 K/uL   Lymphocytes Relative 5 %   Lymphs Abs 1.1 0.7 - 4.0 K/uL   Monocytes Relative 6 %   Monocytes Absolute 1.4 (H) 0.1 - 1.0 K/uL   Eosinophils Relative 0 %   Eosinophils Absolute 0.1 0.0 - 0.5 K/uL   Basophils Relative 0 %   Basophils Absolute 0.1 0.0 - 0.1 K/uL   WBC Morphology MORPHOLOGY UNREMARKABLE    Immature Granulocytes 1 %   Abs Immature Granulocytes 0.29 (H) 0.00 - 0.07 K/uL   Target Cells PRESENT     Comment: Performed at [facility], [address] [place], [place] [address]  Comprehensive metabolic panel     Status: Abnormal   Collection Time: 02/11/18 11:38 AM  Result Value Ref Range   Sodium 149 (H) 135 - 145 mmol/L    Potassium 3.6 3.5 - 5.1 mmol/L   Chloride 112 (H) 98 - 111 mmol/L   CO2 20 (L) 22 - 32 mmol/L   Glucose, Bld 84 70 - 99 mg/dL   BUN 37 (H) 8 - 23 mg/dL   Creatinine, Ser 1.82 (H) 0.61 - 1.24 mg/dL   Calcium 10.2 8.9 - 10.3 mg/dL   Total Protein 7.4 6.5 - 8.1 g/dL   Albumin 2.6 (L) 3.5 - 5.0 g/dL   AST 105 (H) 15 - 41 U/L   ALT 29 0 - 44 U/L   Alkaline Phosphatase 365 (H) 38 - 126 U/L   Total Bilirubin 7.0 (H) 0.3 - 1.2 mg/dL   GFR calc non Af Amer 36 (L) >60 mL/min   GFR calc Af Amer 42 (L) >60 mL/min   Anion gap 17 (H) 5 - 15    Comment: Performed at [facility], [address] [place], [place] [address]  Protime-INR     Status: Abnormal   Collection Time: 02/11/18 11:38 AM  Result Value Ref Range   Prothrombin Time 15.8 (H) 11.4 - 15.2 seconds   INR 1.27     Comment: Performed at [facility], [address], [place], [place] [address]  Type and screen [place]     Status: None (Preliminary result)   Collection Time: 02/11/18 11:38 AM  Result Value Ref Range   ABO/RH(D) A POS    Antibody Screen NEG    Sample Expiration 02/14/2018    Unit Number [id number]    Blood Component Type RED CELLS,LR    Unit division 00    Status of Unit ISSUED    Transfusion Status OK TO TRANSFUSE    Crossmatch Result      Compatible Performed at [facility], [address] [place], [place] [address]   Lactic acid, plasma     Status: Abnormal   Collection Time: 02/11/18 11:38 AM  Result Value Ref Range   Lactic Acid, Venous 4.9 (HH) 0.5 - 1.9 mmol/L    Comment: CRITICAL RESULT CALLED TO, READ BACK BY AND VERIFIED WITH Martinique MOORE @1223  02/11/18 BY FMW Performed at [facility], [address] [place], [place] [address]   Ammonia     Status: None   Collection Time: 02/11/18 11:38 AM  Result Value Ref Range   Ammonia 28 9 - 35 umol/L    Comment: Performed at [facility], [address] [place],   [address]  Urinalysis,  Complete w Microscopic     Status: Abnormal   Collection Time: 02/11/18 12:16 PM  Result Value Ref Range   Color, Urine AMBER (A) YELLOW    Comment: BIOCHEMICALS MAY BE AFFECTED BY COLOR   APPearance CLOUDY (A) CLEAR   Specific Gravity, Urine 1.018 1.005 - 1.030   pH 5.0 5.0 - 8.0   Glucose, UA NEGATIVE NEGATIVE mg/dL   Hgb urine dipstick MODERATE (A) NEGATIVE   Bilirubin Urine SMALL (A) NEGATIVE   Ketones, ur NEGATIVE NEGATIVE mg/dL   Protein, ur 30 (A) NEGATIVE mg/dL   Nitrite NEGATIVE NEGATIVE   Leukocytes, UA NEGATIVE NEGATIVE   RBC / HPF 11-20 0 - 5 RBC/hpf   WBC, UA 0-5 0 - 5 WBC/hpf   Bacteria, UA NONE SEEN NONE SEEN   Squamous Epithelial / LPF 11-20 0 - 5   Mucus PRESENT     Comment: Performed at [facility], [address] [place], [place] [address]  Prepare RBC     Status: None   Collection Time: 02/11/18 12:30 PM  Result Value Ref Range   Order Confirmation      ORDER PROCESSED BY BLOOD BANK Performed at [facility], [address] [place], [place] [address]   Lactic acid, plasma     Status: Abnormal   Collection Time: 02/11/18  1:02 PM  Result Value Ref Range   Lactic Acid, Venous 3.6 (HH) 0.5 - 1.9 mmol/L    Comment: CRITICAL RESULT CALLED TO, READ BACK BY AND VERIFIED WITH STEPHANIE RUDD @1344  02/11/18 BY FMW Performed at [facility], [address] [place], [place] [address]   Procalcitonin     Status: None   Collection Time: 02/11/18  1:02 PM  Result Value Ref Range   Procalcitonin 14.44 ng/mL    Comment:        Interpretation: PCT >= 10 ng/mL: Important systemic inflammatory response, almost exclusively due to severe bacterial sepsis or septic shock. (NOTE)       Sepsis PCT Algorithm           Lower Respiratory Tract                                      Infection PCT Algorithm    ----------------------------     ----------------------------         PCT < 0.25 ng/mL                PCT < 0.10  ng/mL         Strongly encourage             Strongly discourage   discontinuation of antibiotics    initiation of antibiotics    ----------------------------     -----------------------------       PCT 0.25 - 0.50 ng/mL            PCT 0.10 - 0.25 ng/mL               OR       >80% decrease in PCT            Discourage initiation of                                            antibiotics  Encourage discontinuation           of antibiotics    ----------------------------     -----------------------------         PCT >= 0.50 ng/mL              PCT 0.26 - 0.50 ng/mL                AND       <80% decrease in PCT             Encourage initiation of                                             antibiotics       Encourage continuation           of antibiotics    ----------------------------     -----------------------------        PCT >= 0.50 ng/mL                  PCT > 0.50 ng/mL               AND         increase in PCT                  Strongly encourage                                      initiation of antibiotics    Strongly encourage escalation           of antibiotics                                     -----------------------------                                           PCT <= 0.25 ng/mL                                                 OR                                        > 80% decrease in PCT                                     Discontinue / Do not initiate                                             antibiotics Performed at [facility], [address], [place], [place] [address]   ABO/Rh     Status: None   Collection Time: 02/11/18  1:02 PM  Result Value Ref Range  ABO/RH(D)      A POS Performed at [facility], [address] [place], [place] [address]   Glucose, capillary     Status: None   Collection Time: 02/11/18  5:27 PM  Result Value Ref Range   Glucose-Capillary 92 70 - 99 mg/dL  MRSA PCR Screening     Status: None   Collection  Time: 02/11/18  5:31 PM  Result Value Ref Range   MRSA by PCR NEGATIVE NEGATIVE    Comment:        The GeneXpert MRSA Assay (FDA approved for NASAL specimens only), is one component of a comprehensive MRSA colonization surveillance program. It is not intended to diagnose MRSA infection nor to guide or monitor treatment for MRSA infections. Performed at [facility], [address] [place], [place] [address]   Lactic acid, plasma     Status: Abnormal   Collection Time: 02/11/18  5:50 PM  Result Value Ref Range   Lactic Acid, Venous 2.2 (HH) 0.5 - 1.9 mmol/L    Comment: CRITICAL RESULT CALLED TO, READ BACK BY AND VERIFIED WITH [facility] BORBA 02/11/18 @ [place] Performed at [facility], [address] [place], [place] [address]    Dg Chest Portable 1 View  Result Date: 02/11/2018 CLINICAL DATA:  Failure to thrive, loss of 50 pounds over the last year, history of lung carcinoma EXAM: PORTABLE CHEST 1 VIEW COMPARISON:  PET-CT of 12/25/2017 and chest x-ray of 12/14/2017 FINDINGS: The left lower lobe mass appears to have increased in size consistent with progression of lung carcinoma. Also there are several nodular opacities in the right lower lobe most consistent with metastases contra laterally with a probable 1 testis is at the left lung base as well. No pleural effusion is seen. The heart is within normal limits in size. The bones are unremarkable. IMPRESSION: 1. Progression of known left lower lobe malignancy with apparent bilateral lung metastases right more numerous than left by chest x-ray. 2. No pneumonia or effusion is seen. Electronically Signed   By: Ivar Drape M.D.   On: 02/11/2018 12:14    Assessment:  The patient is a 72 y.o.  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  PET scan revealed metastatic disease to the  liver, lungs, right adrenal gland, and possibly a porta hepatis lymph node. The favored primary site was a dominant mass in the left lower lobe. The areas of more diffuse hypermetabolism, including within lung lesions and the posterior right hepatic lobe mass, would be amendable to highest yield sampling.  There was trace right pleural fluid and small volume pelvic ascites.  Patient has been hesistant about treatment.  His son wishes him to pursue therapy.  Symptomatically, he has continued to decline in health.  He needs assistance with baths.  Exam reveals massive hepatomegaly.  Creatinine is 1.82.  Bilirubin is 7.  Plan:   1.  Metastatic cholangiocarcinoma:  Discuss with patient and his son thoughts about therapy.  He has been reluctant in the past.  His performance status has declined.  His liver function has worsened.  Currently his renal function has also declined (CrCl 30 ml/min).  Original plan was gemcitabine ans cisplatin.  Other options included: gemcitabine + oxaliplatin or gemcitabine + Abraxane.   Cisplatin problematic given patient's renal function.  Oxaliplatin and gemcitabine can be dose reduced.  Given his borderline performance status: gemcitabine  alone or 5FU/LV could be utilized.  If decision is made to pursue chemotherapy, would perform a liver ultrasound to assess if there is any component of biliary obstruction for possible stent placement.  Discuss my concern for tolerance of any chemotherapy.  Patient still remains hesistant about treatment.  Agree with palliative care consult. 2.  Hematology:  Patient with a microcytic anemia.  Etiology secondary to anemia of chronic disease   He is currently undergoing transfusion of PRBCs. 3.  Infection Disease:  Lactic acid elevated.  Patient hypotensive.  Leukocytosis.  Patient placed on broad spectrum antibiotics (Cefepime x 1, vancomycin x 1 then Zosyn).  Cultures negative to date. 4.  Code status:  Discuss code status in  detail with patient and his son.  His son wishes that everything be done.  I am unsure of the patient's thoughts as he looked to his son for answers.  Discuss importance of patient defining his medical power of attorney.   Thank you for allowing me to participate in Amr Sturtevant 's care.  I will follow him closely with you while hospitalized and after discharge in the outpatient department.   Lequita Asal, MD  02/11/2018, 8:48 PM

## 2018-02-11 NOTE — ED Notes (Signed)
EDP at bedside at this time.  

## 2018-02-11 NOTE — Telephone Encounter (Signed)
Son called reporting that he wants his father seen and possibly admitted, he is not doing well and may need a transfusion. He has stage four cancer and is not doing well. Appointment given for 945 this morning and accepted

## 2018-02-11 NOTE — ED Notes (Signed)
Spoke to pts son Shane Russo on the phone and was given verbal consent to transfuse blood. Witnessed by [employer] States that he makes medical decisions for his father due to his father's confused mental state.

## 2018-02-11 NOTE — ED Notes (Signed)
Megan, RN attempted x 2 for blood collection without success.

## 2018-02-11 NOTE — ED Notes (Signed)
Per Merrily Pew , MD for palliative care please call once son is contacted at [phone]

## 2018-02-11 NOTE — ED Notes (Signed)
Lab called this RN regarding needing ABO, this RN requested lab to come draw blood as lab had to draw previous sample.

## 2018-02-12 ENCOUNTER — Telehealth: Payer: Self-pay | Admitting: *Deleted

## 2018-02-12 DIAGNOSIS — E876 Hypokalemia: Secondary | ICD-10-CM

## 2018-02-12 LAB — CBC
HCT: 28 % — ABNORMAL LOW (ref 39.0–52.0)
Hemoglobin: 8.9 g/dL — ABNORMAL LOW (ref 13.0–17.0)
MCH: 24.8 pg — ABNORMAL LOW (ref 26.0–34.0)
MCHC: 31.8 g/dL (ref 30.0–36.0)
MCV: 78 fL — ABNORMAL LOW (ref 80.0–100.0)
Platelets: 381 10*3/uL (ref 150–400)
RBC: 3.59 MIL/uL — ABNORMAL LOW (ref 4.22–5.81)
RDW: 19.8 % — ABNORMAL HIGH (ref 11.5–15.5)
WBC: 22.1 10*3/uL — ABNORMAL HIGH (ref 4.0–10.5)
nRBC: 0.3 % — ABNORMAL HIGH (ref 0.0–0.2)

## 2018-02-12 LAB — BASIC METABOLIC PANEL
Anion gap: 11 (ref 5–15)
BUN: 39 mg/dL — ABNORMAL HIGH (ref 8–23)
CO2: 23 mmol/L (ref 22–32)
Calcium: 9.5 mg/dL (ref 8.9–10.3)
Chloride: 115 mmol/L — ABNORMAL HIGH (ref 98–111)
Creatinine, Ser: 1.79 mg/dL — ABNORMAL HIGH (ref 0.61–1.24)
GFR calc Af Amer: 43 mL/min — ABNORMAL LOW (ref >60)
GFR calc non Af Amer: 37 mL/min — ABNORMAL LOW (ref >60)
Glucose, Bld: 96 mg/dL (ref 70–99)
Potassium: 3.3 mmol/L — ABNORMAL LOW (ref 3.5–5.1)
Sodium: 149 mmol/L — ABNORMAL HIGH (ref 135–145)

## 2018-02-12 LAB — TYPE AND SCREEN
ABO/RH(D): A POS
Antibody Screen: NEGATIVE
Unit division: 0

## 2018-02-12 LAB — BPAM RBC
Blood Product Expiration Date: 201912242359
ISSUE DATE / TIME: 201912051623
Unit Type and Rh: 6200

## 2018-02-12 LAB — URINE CULTURE: Culture: NO GROWTH

## 2018-02-12 LAB — PROCALCITONIN: PROCALCITONIN: 15.15 ng/mL

## 2018-02-12 MED ORDER — LORAZEPAM 2 MG/ML IJ SOLN
2.0000 mg | INTRAMUSCULAR | Status: DC | PRN
  Administered 2018-02-12 – 2018-02-14 (×3): 2 mg via INTRAVENOUS
  Filled 2018-02-12 (×3): qty 1

## 2018-02-12 MED ORDER — NICOTINE 14 MG/24HR TD PT24
14.0000 mg | MEDICATED_PATCH | Freq: Every day | TRANSDERMAL | Status: DC
  Administered 2018-02-13 – 2018-02-15 (×2): 14 mg via TRANSDERMAL
  Filled 2018-02-12 (×2): qty 1

## 2018-02-12 MED ORDER — SODIUM CHLORIDE 0.9 % IV SOLN
2.0000 g | Freq: Two times a day (BID) | INTRAVENOUS | Status: DC
  Administered 2018-02-12 – 2018-02-13 (×4): 2 g via INTRAVENOUS
  Filled 2018-02-12 (×7): qty 2

## 2018-02-12 MED ORDER — POTASSIUM CHLORIDE 10 MEQ/100ML IV SOLN
10.0000 meq | INTRAVENOUS | Status: AC
  Administered 2018-02-12 (×2): 10 meq via INTRAVENOUS
  Filled 2018-02-12 (×2): qty 100

## 2018-02-12 MED ORDER — POTASSIUM CHLORIDE CRYS ER 20 MEQ PO TBCR
20.0000 meq | EXTENDED_RELEASE_TABLET | Freq: Once | ORAL | Status: DC
  Filled 2018-02-12: qty 1

## 2018-02-12 NOTE — Telephone Encounter (Signed)
MRI called to report that Shane Russo is scheduled for MRI Monday, but he is in ICU. They are asking if we want to cancel the MRI appointment for Monday.

## 2018-02-12 NOTE — Progress Notes (Signed)
[facility] [place] NAME: Shane Russo    MR#:  [id number]  DATE OF BIRTH:  Oct 22, 1945  SUBJECTIVE:  CHIEF COMPLAINT:   Chief Complaint  Patient presents with  . Failure To Thrive  Patient seen and evaluated today Has decreased responsiveness On oxygen via nasal cannula Lethargic unable to give any history  REVIEW OF SYSTEMS:    ROS  Could not be obtained secondary to lethargy  DRUG ALLERGIES:  No Known Allergies  VITALS:  Blood pressure 106/68, pulse (!) 104, temperature (!) 97.5 F (36.4 C), temperature source Oral, resp. rate (!) 34, height 6\' 4"  (1.93 m), weight 59.6 kg, SpO2 97 %.  PHYSICAL EXAMINATION:   Physical Exam  GENERAL:  72 y.o.-year-old patient lying in the bed on oxygen via nasal cannula EYES: Pupils equal, round, reactive to light and accommodation. No scleral icterus. Extraocular muscles intact.  HEENT: Head atraumatic, normocephalic. Oropharynx and nasopharynx clear.  NECK:  Supple, no jugular venous distention. No thyroid enlargement, no tenderness.  LUNGS: Normal breath sounds bilaterally, no wheezing, rales, rhonchi. No use of accessory muscles of respiration.  CARDIOVASCULAR: S1, S2 tachycardia present. No murmurs, rubs, or gallops.  ABDOMEN: Soft, nontender, nondistended. Bowel sounds present. No organomegaly or mass.  EXTREMITIES: No cyanosis, clubbing or edema b/l.    NEUROLOGIC: Lethargic with decreased responsiveness Unable to perform complete nervous system exam PSYCHIATRIC: The patient is alert and oriented x none SKIN: No obvious rash, lesion, or ulcer.   LABORATORY PANEL:   CBC Recent Labs  Lab 02/12/18 0349  WBC 22.1*  HGB 8.9*  HCT 28.0*  PLT 381   ------------------------------------------------------------------------------------------------------------------ Chemistries  Recent Labs  Lab 02/11/18 1138 02/12/18 0349  NA 149* 149*  K 3.6 3.3*  CL 112* 115*  CO2 20* 23  GLUCOSE  84 96  BUN 37* 39*  CREATININE 1.82* 1.79*  CALCIUM 10.2 9.5  AST 105*  --   ALT 29  --   ALKPHOS 365*  --   BILITOT 7.0*  --    ------------------------------------------------------------------------------------------------------------------  Cardiac Enzymes No results for input(s): TROPONINI in the last 168 hours. ------------------------------------------------------------------------------------------------------------------  RADIOLOGY:  Dg Chest Portable 1 View  Result Date: 02/11/2018 CLINICAL DATA:  Failure to thrive, loss of 50 pounds over the last year, history of lung carcinoma EXAM: PORTABLE CHEST 1 VIEW COMPARISON:  PET-CT of 12/25/2017 and chest x-ray of 12/14/2017 FINDINGS: The left lower lobe mass appears to have increased in size consistent with progression of lung carcinoma. Also there are several nodular opacities in the right lower lobe most consistent with metastases contra laterally with a probable 1 testis is at the left lung base as well. No pleural effusion is seen. The heart is within normal limits in size. The bones are unremarkable. IMPRESSION: 1. Progression of known left lower lobe malignancy with apparent bilateral lung metastases right more numerous than left by chest x-ray. 2. No pneumonia or effusion is seen. Electronically Signed   By: Ivar Drape M.D.   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 currently in ICU for hypotension and dehydration failure to thrive  -Hypotension and shock Patient received IV fluids with broad-spectrum antibiotics to cover for sepsis Patient was given stress dose steroids Follow-up lactic acid  -Metastatic cancer Poor prognosis  -Sepsis Was given broad-spectrum IV antibiotics and IV fluids  Follow-up lactic acid  -Renal failure CKD stage III mild To  renal function  -Palliative care discussions and goals of care discussed with family Patient was made DNR  -Family does not want any aggressive measures such as IV fluid boluses, IV pressor medication broad-spectrum antibiotics apart from intubation ventilator and CPR Transitioning to comfort care All the records are reviewed and case discussed with Care Management/Social Worker. Management plans discussed with the patient, family and they are in agreement.  CODE STATUS: DNR  DVT Prophylaxis: SCDs  TOTAL TIME TAKING CARE OF THIS PATIENT: 35 minutes.   POSSIBLE D/C IN 1 to 2 DAYS, DEPENDING ON CLINICAL CONDITION.  Saundra Shelling M.D on 02/12/2018 at 11:29 AM  Between 7am to 6pm - Pager - [phone]  After 6pm go to [email or web address] - password [email or web address] [place] [facility]  Office  [phone]  CC: Primary care physician; Patient, No Pcp Per  Note: This dictation was prepared with Dragon dictation along with smaller phrase technology. Any transcriptional errors that result from this process are unintentional.

## 2018-02-12 NOTE — Progress Notes (Signed)
New hospice home referral received from [facility]. Writer spoke with patient's son Shane Russo and other family members in the room to provide education regarding hospice home services and philosophy with understanding voiced. Shane Russo made aware that there are currently no beds available at the  hospice home. He voiced understanding. Hospital care team aware. Patient seen lying in bed, eyes open at times, speech very hoarse and difficult to understand. Patient is currently not receiving comfort medications. Family asked about comfort feeds, he is currently NPO. Staff RN Shane Russo made aware. .Patient information faxed to referral. Patient placed on hospice home wait list.  Shane Shanks RN, BSN, [place] [facility] [place], hospital Liaison [phone]

## 2018-02-12 NOTE — Progress Notes (Signed)
Shane Russo  [phone] [phone]   Name: Shane Russo Date: 02/12/2018 MRN: [id number]  DOB: [birth year]  Patient Care Team: Patient, No Pcp Per as PCP - General (General Practice) Spaete, Lenetta Quaker, MD as Consulting Physician (Gastroenterology)    REASON FOR CONSULTATION: Palliative Care consult requested for this 73 y.o. 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  PMH also notable for tobacco abuse, hypothyroidism, AOCD, history of alcohol abuse.  He underwent recent biopsy but results were inconclusive of etiology of the tumor due to large amount of necrosis.  Patient was noted to have poor oral intake with rapid weight loss and declining performance status.  There was discussion about further work-up versus transition to hospice care.  Patient is now admitted on 02/11/2018 with failure to thrive and probable sepsis.  Source of sepsis is unclear but the patient is hypotensive with hypernatremia and acute renal failure.  Palliative care was consulted to help address goals.   CODE STATUS: DNR  PAST MEDICAL HISTORY: Past Medical History:  Diagnosis Date  . Anemia   . Hypertension   . Thyroid disease     PAST SURGICAL HISTORY:  Past Surgical History:  Procedure Laterality Date  . ESOPHAGOGASTRODUODENOSCOPY (EGD) WITH PROPOFOL N/A 12/29/2017   Procedure: ESOPHAGOGASTRODUODENOSCOPY (EGD) WITH PROPOFOL;  Surgeon: Lin Landsman, MD;  Location: [facility];  Service: Gastroenterology;  Laterality: N/A;  . none      HEMATOLOGY/ONCOLOGY HISTORY:   No history exists.    ALLERGIES:  has No Known Allergies.  MEDICATIONS:  Current Facility-Administered Medications  Medication Dose Route Frequency Provider Last Rate Last Dose  . 0.9 %  sodium chloride infusion  10 mL/hr Intravenous  Once Merlyn Lot, MD      . acetaminophen (TYLENOL) tablet 650 mg  650 mg Oral Q6H PRN Henreitta Leber, MD       Or  . acetaminophen (TYLENOL) suppository 650 mg  650 mg Rectal Q6H PRN Sainani, Belia Heman, MD      . dextrose 5 % solution   Intravenous Continuous Henreitta Leber, MD 75 mL/hr at 02/12/18 0600    . dronabinol (MARINOL) capsule 2.5 mg  2.5 mg Oral BID AC Sainani, Vivek J, MD      . feeding supplement (ENSURE ENLIVE) (ENSURE ENLIVE) liquid 237 mL  237 mL Oral BID BM Sainani, Belia Heman, MD      . folic acid (FOLVITE) tablet 1 mg  1 mg Oral Daily Sainani, Belia Heman, MD      . heparin injection 5,000 Units  5,000 Units Subcutaneous Q8H Henreitta Leber, MD   5,000 Units at 02/12/18 0525  . levothyroxine (SYNTHROID, LEVOTHROID) tablet 25 mcg  25 mcg Oral Q0600 Henreitta Leber, MD      . LORazepam (ATIVAN) injection 2 mg  2 mg Intravenous Q4H PRN Darel Hong D, NP   2 mg at 02/12/18 0243  . multivitamin with minerals tablet 1 tablet  1 tablet Oral Daily Sainani, Belia Heman, MD      . nicotine (NICODERM CQ - dosed in mg/24 hours) patch 14 mg  14 mg Transdermal Daily Darel Hong D, NP      . ondansetron (ZOFRAN) tablet 4 mg  4 mg Oral Q6H PRN Henreitta Leber, MD       Or  . ondansetron [facility]) injection 4  mg  4 mg Intravenous Q6H PRN Henreitta Leber, MD      . piperacillin-tazobactam (ZOSYN) IVPB 3.375 g  3.375 g Intravenous Q8H Cyndee Brightly M, RPH 12.5 mL/hr at 02/12/18 0533 3.375 g at 02/12/18 0533  . vancomycin (VANCOCIN) IVPB 750 mg/150 ml premix  750 mg Intravenous Q24H Paticia Stack, [place] 150 mL/hr at 02/11/18 2113 750 mg at 02/11/18 2113    VITAL SIGNS: BP 106/68   Pulse (!) 104   Temp (!) 97.5 F (36.4 C) (Oral)   Resp (!) 34   Ht '6\' 4"'$  (1.93 m)   Wt 131 lb 6.3 oz (59.6 kg)   SpO2 97%   BMI 15.99 kg/m  Filed Weights   02/11/18 1046 02/11/18 1727  Weight: 140 lb 2 oz (63.6 kg) 131 lb 6.3 oz (59.6 kg)    Estimated body mass index is 15.99 kg/m as  calculated from the following:   Height as of this encounter: '6\' 4"'$  (1.93 m).   Weight as of this encounter: 131 lb 6.3 oz (59.6 kg).  LABS: CBC:    Component Value Date/Time   WBC 22.1 (H) 02/12/2018 0349   HGB 8.9 (L) 02/12/2018 0349   HCT 28.0 (L) 02/12/2018 0349   PLT 381 02/12/2018 0349   MCV 78.0 (L) 02/12/2018 0349   NEUTROABS 21.5 (H) 02/11/2018 1138   LYMPHSABS 1.1 02/11/2018 1138   MONOABS 1.4 (H) 02/11/2018 1138   EOSABS 0.1 02/11/2018 1138   BASOSABS 0.1 02/11/2018 1138   Comprehensive Metabolic Panel:    Component Value Date/Time   NA 149 (H) 02/12/2018 0349   K 3.3 (L) 02/12/2018 0349   CL 115 (H) 02/12/2018 0349   CO2 23 02/12/2018 0349   BUN 39 (H) 02/12/2018 0349   CREATININE 1.79 (H) 02/12/2018 0349   GLUCOSE 96 02/12/2018 0349   CALCIUM 9.5 02/12/2018 0349   AST 105 (H) 02/11/2018 1138   ALT 29 02/11/2018 1138   ALKPHOS 365 (H) 02/11/2018 1138   BILITOT 7.0 (H) 02/11/2018 1138   PROT 7.4 02/11/2018 1138   ALBUMIN 2.6 (L) 02/11/2018 1138    RADIOGRAPHIC STUDIES: Dg Chest Portable 1 View  Result Date: 02/11/2018 CLINICAL DATA:  Failure to thrive, loss of 50 pounds over the last year, history of lung carcinoma EXAM: PORTABLE CHEST 1 VIEW COMPARISON:  PET-CT of 12/25/2017 and chest x-ray of 12/14/2017 FINDINGS: The left lower lobe mass appears to have increased in size consistent with progression of lung carcinoma. Also there are several nodular opacities in the right lower lobe most consistent with metastases contra laterally with a probable 1 testis is at the left lung base as well. No pleural effusion is seen. The heart is within normal limits in size. The bones are unremarkable. IMPRESSION: 1. Progression of known left lower lobe malignancy with apparent bilateral lung metastases right more numerous than left by chest x-ray. 2. No pneumonia or effusion is seen. Electronically Signed   By: Ivar Drape M.D.   On: 02/11/2018 12:14    PERFORMANCE STATUS  (ECOG) : 4 - Bedbound  Review of Systems As noted above. Otherwise, a complete review of systems is negative.  Physical Exam General: Critically ill-appearing Cardiovascular: regular rate and rhythm Pulmonary: clear ant fields, on O2 Abdomen: Firm GU: Foley Extremities: no edema Skin: no rashes Neurological: Poorly responsive, did not open eyes on exam nor did he speak, does not withdrawal from noxious stimuli  IMPRESSION: Dr. Mike Gip and I met with patient's son, Broadus Audrick.  Son updated on patient's medical problems and current clinical status.  Patient has clinically declined overnight.  He is significantly less responsive today than he was yesterday.  He remains in ICU.  Son was appropriately tearful as we discussed patient's medical status.  He says he recognizes that patient has declined.  He had hoped that patient would improve and  pursue treatment, although he says he realizes that patient probably would not want that.  Son says that patient hated needles and other types of medical procedures.  Son says his primary goal is to ensure that his father does not suffer.  He has started thinking about patient's funeral and cremation.  We discussed option for consideration of low-dose chemotherapy in the event the patient were to clinically improve.  However, it seems increasingly unlikely for that to be a feasible option.  Son stated several times that he just wanted his father to be kept comfortable.  Comfort care was explained in detail including stopping all non-symptom related medications.  Son would be interested in transfer to the [facility] for end-of-life care.  However, son does not want to initiate comfort measures or transfer him to hospice until he has had a chance to get patient's daughter here to see him and explore the possibility of patient's other son also coming to the hospital.  Unfortunately, patient's other son, Vonna Kotyk, is currently incarcerated at [facility].  Broadus Child requested that we explore the option of a temporary release from prison given the terminal nature of patient's clinical condition.  Note that patient's other daughter reportedly has mental health problems and lives in a group home.   We talked about code status in detail. Son verbalized an understanding that resuscitation would likely prove futile in the setting of advanced cancer. He says that patient would not want to be resuscitated and he agreed with changing the code status to DNR.   PLAN: Continue all medical treatment for now Consider transition to comfort care once son has a chance to have siblings visit DNR  Time Total: 60 minutes  Visit consisted of counseling and education dealing with the complex and emotionally intense issues of symptom management and palliative care in the setting of serious and potentially life-threatening illness.Greater than 50%  of this time was spent counseling and coordinating care related to the above assessment and plan.  Signed by: Altha Harm, PhD, NP-C [phone] (Work Cell)

## 2018-02-12 NOTE — Clinical Social Work Note (Signed)
Clinical Social Work Assessment  Patient Details  Name: Shane Russo MRN: [id number] Date of Birth: 08/10/1945  Date of referral:  02/12/18               Reason for consult:  Facility Placement                Permission sought to share information with:  Case Manager, Customer service manager, Family Supports Permission granted to share information::  Yes, Verbal Permission Granted  Name::        Agency::     Relationship::     Contact Information:     Housing/Transportation Living arrangements for the past 2 months:  Single Family Home Source of Information:  Adult Children Patient Interpreter Needed:  None Criminal Activity/Legal Involvement Pertinent to Current Situation/Hospitalization:  No - Comment as needed Significant Relationships:  Adult Children Lives with:  Adult Children Do you feel safe going back to the place where you live?  Yes Need for family participation in patient care:  Yes (Comment)  Care giving concerns:  Patient lives with son in [place]    Social Worker assessment / plan:  CSW consulted for residential hospice placement. CSW met with patient and son Shane Russo at bedside. CSW introduced self and explained role. Patient is asleep and unable to complete assessment. CSW asked patient's son what their goal is for patient. Son states that their goal is to keep patient comfortable and transition to hospice home in [place]. CSW explained process for transfer to hospice home. CSW also explained that there are currently no beds available for hospice home today. Family understands and in agreement with discharge to hospice home in [facility] when a bed is available. CSW notified Shane Russo, hospice liaison of referral. CSW will continue to follow for discharge planning.   Employment status:  Retired Forensic scientist:  Medicare PT Recommendations:  Not assessed at this time Information / Referral to community resources:     Patient/Family's Response  to care:  Son thanked CSW for assistance   Patient/Family's Understanding of and Emotional Response to Diagnosis, Current Treatment, and Prognosis:  Son in agreement with discharge to hospice home when bed is available.   Emotional Assessment Appearance:  Appears older than stated age Attitude/Demeanor/Rapport:  Unresponsive Affect (typically observed):  Unable to Assess Orientation:    Alcohol / Substance use:  Not Applicable Psych involvement (Current and /or in the community):  No (Comment)  Discharge Needs  Concerns to be addressed:  Discharge Planning Concerns Readmission within the last 30 days:  No Current discharge risk:  None Barriers to Discharge:  Continued Medical Work up   [employer], [place] 02/12/2018, 2:06 PM

## 2018-02-12 NOTE — Telephone Encounter (Signed)
Per Gaspar Bidding 02/12/18 Patient Call Notes:The MRI can be cancelled. He is transitioning to hospice care. MRI was cancelled as requested. Called patient's son to make him aware that the MRI schedule for 02/21/2018 had been cancelled.

## 2018-02-12 NOTE — Progress Notes (Signed)
Initial Nutrition Assessment  DOCUMENTATION CODES:   Severe malnutrition in context of chronic illness  INTERVENTION:   Ensure Enlive po BID, each supplement provides 350 kcal and 20 grams of protein  Magic cup TID with meals, each supplement provides 290 kcal and 9 grams of protein  Pt likely at high refeeding risk; recommend monitor K, Mg and P labs   NUTRITION DIAGNOSIS:   Severe Malnutrition related to cancer and cancer related treatments as evidenced by severe fat depletion, severe muscle depletion, 10 percent weight loss in <2 months.  GOAL:   Patient will meet greater than or equal to 90% of their needs  MONITOR:   PO intake, Supplement acceptance, Labs, Weight trends, Skin, I & O's  REASON FOR ASSESSMENT:   Malnutrition Screening Tool    ASSESSMENT:   72 y.o. male.with a PMH of Hypothyroidism, HTN, Tobacco Abuse,ETOH Abuse, OCD, and Anemia. He presented to [facility] ER on 12/5 via EMS with failure to thrive.    RD familiar with this pt from previous admits. Pt with poor appetite and oral intake at baseline. Pt with chronic severe malnutrition. Pt does enjoy chocolate Ensure. Pt has continued to loose weight; has lost 15lbs(10%) since his last admit 2 months ago. Palliative care following; pt to likely transition to comfort care. Pt at high refeeding risk; dextrose started today. RD will monitor for GOC.   Medications reviewed and include: marinol, folic acid, heparin, synthroid, MVI, nicotine, cefepime, 5% dextrose @75ml /hr  Labs reviewed: Na 149(H), K 3.3(L), BUN 39(H), creat 1.79(H) Wbc- 22.1(H), Hgb 8.9(L), Hct 28.0(L), MCV(L), MCH 24.8(L)  NUTRITION - FOCUSED PHYSICAL EXAM:    Most Recent Value  Orbital Region  Moderate depletion  Upper Arm Region  Severe depletion  Thoracic and Lumbar Region  Severe depletion  Buccal Region  Severe depletion  Temple Region  Severe depletion  Clavicle Bone Region  Severe depletion  Clavicle and Acromion Bone Region  Severe  depletion  Scapular Bone Region  Severe depletion  Dorsal Hand  Severe depletion  Patellar Region  Severe depletion  Anterior Thigh Region  Severe depletion  Posterior Calf Region  Severe depletion  Edema (RD Assessment)  None  Hair  Reviewed  Eyes  Reviewed  Mouth  Reviewed  Skin  Reviewed  Nails  Reviewed     Diet Order:   Diet Order            Diet NPO time specified  Diet effective now             EDUCATION NEEDS:   No education needs have been identified at this time  Skin:  Skin Assessment: Reviewed RN Assessment  Last BM:  pta  Height:   Ht Readings from Last 1 Encounters:  02/11/18 6\' 4"  (1.93 m)    Weight:   Wt Readings from Last 1 Encounters:  02/11/18 59.6 kg    Ideal Body Weight:  91.8 kg  BMI:  Body mass index is 15.99 kg/m.  Estimated Nutritional Needs:   Kcal:  2000-2300kcal/day   Protein:  90-101g/day   Fluid:  >1.5L/day   Koleen Distance MS, RD, LDN Pager #- [phone] Office#- [phone] After Hours Pager: [phone]

## 2018-02-12 NOTE — Progress Notes (Signed)
[facility] Hematology/Oncology Progress Note  Date of admission: 02/11/2018  Hospital day:  02/12/2018  Chief Complaint: Shane Russo is a 72 y.o. 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  Subjective:  Patient unable to communicate meaningfully.  He was able to squeeze my hands.  He did not acknowledge any pain.  He was not in distress.  He did not speak to me or his son.  He did not open his eyes.  Social History: The patient is accompanied by his son, Shane Russo, Shane Harm, NP, and his nurse today.  Allergies: No Known Allergies  Scheduled Medications: . dronabinol  2.5 mg Oral BID AC  . feeding supplement (ENSURE ENLIVE)  237 mL Oral BID BM  . folic acid  1 mg Oral Daily  . heparin  5,000 Units Subcutaneous Q8H  . levothyroxine  25 mcg Oral Q0600  . multivitamin with minerals  1 tablet Oral Daily  . nicotine  14 mg Transdermal Daily    Review of Systems: Performance status:  4 Patient unable to provide ROS.  He indicates by hand squeeze that he has no pain.  Physical Exam: Blood pressure (!) 87/56, pulse (!) 101, temperature (!) 97.4 F (36.3 C), temperature source Oral, resp. rate 16, height 6\' 4"  (1.93 m), weight 131 lb 6.3 oz (59.6 kg), SpO2 94 %.  GENERAL:  Thin gentleman lying comfortably in the ICU in no acute distress. MENTAL STATUS: Unable to assess.  He did not respond to his name.  He did not speak. HEAD:  Short hair.  Temporal wasting.  Normocephalic, atraumatic, face symmetric, no Cushingoid features. EYES:  Brown eyes. Scleral icterus. ENT:  Oropharynx clear without lesion.  Tongue normal. Poor dentition.  RESPIRATORY:  Clear to auscultation anteriorly without rales, wheezes or rhonchi. CARDIOVASCULAR:  Regular rate and rhythm without murmur, rub or gallop. ABDOMEN:  Soft,  non-tender, with active bowel sounds.  Massive hepatomegaly.  SKIN:  No rashes, ulcers or lesions. EXTREMITIES: Thin.  No edema, no skin discoloration or tenderness.  No palpable cords. NEUROLOGICAL: Moves upper extremities.  Squeezes right and left hand on command. PSYCH:  Unable to assess.   Results for orders placed or performed during the hospital encounter of 02/11/18 (from the past 48 hour(s))  CBC with Differential/Platelet     Status: Abnormal   Collection Time: 02/11/18 11:38 AM  Result Value Ref Range   WBC 24.6 (H) 4.0 - 10.5 K/uL   RBC 3.15 (L) 4.22 - 5.81 MIL/uL   Hemoglobin 7.6 (L) 13.0 - 17.0 g/dL   HCT 25.1 (L) 39.0 - 52.0 %   MCV 79.7 (L) 80.0 - 100.0 fL   MCH 24.1 (L) 26.0 - 34.0 pg   MCHC 30.3 30.0 - 36.0 g/dL   RDW 21.3 (H) 11.5 - 15.5 %   Platelets 431 (H) 150 - 400 K/uL   nRBC 0.2 0.0 - 0.2 %   Neutrophils Relative % 88 %   Neutro Abs 21.5 (H) 1.7 - 7.7 K/uL   Lymphocytes Relative 5 %   Lymphs Abs 1.1 0.7 - 4.0 K/uL   Monocytes Relative 6 %   Monocytes Absolute 1.4 (H) 0.1 - 1.0 K/uL   Eosinophils Relative 0 %   Eosinophils Absolute 0.1 0.0 - 0.5 K/uL   Basophils Relative 0 %   Basophils Absolute 0.1 0.0 - 0.1 K/uL   WBC  Morphology MORPHOLOGY UNREMARKABLE    Immature Granulocytes 1 %   Abs Immature Granulocytes 0.29 (H) 0.00 - 0.07 K/uL   Target Cells PRESENT     Comment: Performed at [facility], [address] [place], [place] [address]  Comprehensive metabolic panel     Status: Abnormal   Collection Time: 02/11/18 11:38 AM  Result Value Ref Range   Sodium 149 (H) 135 - 145 mmol/L   Potassium 3.6 3.5 - 5.1 mmol/L   Chloride 112 (H) 98 - 111 mmol/L   CO2 20 (L) 22 - 32 mmol/L   Glucose, Bld 84 70 - 99 mg/dL   BUN 37 (H) 8 - 23 mg/dL   Creatinine, Ser 1.82 (H) 0.61 - 1.24 mg/dL   Calcium 10.2 8.9 - 10.3 mg/dL   Total Protein 7.4 6.5 - 8.1 g/dL   Albumin 2.6 (L) 3.5 - 5.0 g/dL   AST 105 (H) 15 - 41 U/L   ALT 29 0 - 44 U/L   Alkaline  Phosphatase 365 (H) 38 - 126 U/L   Total Bilirubin 7.0 (H) 0.3 - 1.2 mg/dL   GFR calc non Af Amer 36 (L) >60 mL/min   GFR calc Af Amer 42 (L) >60 mL/min   Anion gap 17 (H) 5 - 15    Comment: Performed at [facility], [address] [place], [place] [address]  Protime-INR     Status: Abnormal   Collection Time: 02/11/18 11:38 AM  Result Value Ref Range   Prothrombin Time 15.8 (H) 11.4 - 15.2 seconds   INR 1.27     Comment: Performed at [facility], [address] [place], [place] [address]  Type and screen [place]     Status: None   Collection Time: 02/11/18 11:38 AM  Result Value Ref Range   ABO/RH(D) A POS    Antibody Screen NEG    Sample Expiration 02/14/2018    Unit Number [id number]    Blood Component Type RED CELLS,LR    Unit division 00    Status of Unit ISSUED,FINAL    Transfusion Status OK TO TRANSFUSE    Crossmatch Result      Compatible Performed at [facility], [address] [place], [place] [address]   Blood culture (routine x 2)     Status: None (Preliminary result)   Collection Time: 02/11/18 11:38 AM  Result Value Ref Range   Specimen Description BLOOD BLOOD LEFT WRIST    Special Requests      BOTTLES DRAWN AEROBIC AND ANAEROBIC Blood Culture results may not be optimal due to an inadequate volume of blood received in culture bottles   Culture      NO GROWTH < 24 HOURS Performed at [facility], [address] [place], [place] [address]    Report Status PENDING   Lactic acid, plasma     Status: Abnormal   Collection Time: 02/11/18 11:38 AM  Result Value Ref Range   Lactic Acid, Venous 4.9 (HH) 0.5 - 1.9 mmol/L    Comment: CRITICAL RESULT CALLED TO, READ BACK BY AND VERIFIED WITH Martinique MOORE @1223  02/11/18 BY FMW Performed at [facility], [address] [place], [place] [address]   Ammonia     Status: None   Collection Time: 02/11/18 11:38 AM  Result Value Ref Range    Ammonia 28 9 - 35 umol/L    Comment: Performed at [facility], [address] [place], [place]  [address]  Blood culture (routine x 2)     Status: None (Preliminary result)   Collection Time: 02/11/18 11:56 AM  Result Value Ref Range   Specimen Description BLOOD BLOOD LEFT HAND    Special Requests      BOTTLES DRAWN AEROBIC AND ANAEROBIC Blood Culture adequate volume   Culture      NO GROWTH < 24 HOURS Performed at [facility], [address] [place], [place] [address]    Report Status PENDING   Urine culture     Status: None   Collection Time: 02/11/18 12:16 PM  Result Value Ref Range   Specimen Description      URINE, RANDOM Performed at [facility], [address] [place], [place] [address]    Special Requests      NONE Performed at [facility], [address], [place], [place] [address]    Culture      NO GROWTH Performed at [facility], [address] [place], [place] [address]    Report Status 02/12/2018 FINAL   Urinalysis, Complete w Microscopic     Status: Abnormal   Collection Time: 02/11/18 12:16 PM  Result Value Ref Range   Color, Urine AMBER (A) YELLOW    Comment: BIOCHEMICALS MAY BE AFFECTED BY COLOR   APPearance CLOUDY (A) CLEAR   Specific Gravity, Urine 1.018 1.005 - 1.030   pH 5.0 5.0 - 8.0   Glucose, UA NEGATIVE NEGATIVE mg/dL   Hgb urine dipstick MODERATE (A) NEGATIVE   Bilirubin Urine SMALL (A) NEGATIVE   Ketones, ur NEGATIVE NEGATIVE mg/dL   Protein, ur 30 (A) NEGATIVE mg/dL   Nitrite NEGATIVE NEGATIVE   Leukocytes, UA NEGATIVE NEGATIVE   RBC / HPF 11-20 0 - 5 RBC/hpf   WBC, UA 0-5 0 - 5 WBC/hpf   Bacteria, UA NONE SEEN NONE SEEN   Squamous Epithelial / LPF 11-20 0 - 5   Mucus PRESENT     Comment: Performed at [facility], [address], [place], [place] [address]  Prepare RBC     Status: None   Collection Time: 02/11/18 12:30 PM  Result Value Ref Range   Order Confirmation       ORDER PROCESSED BY BLOOD BANK Performed at [facility], [address] [place], [place] [address]   Lactic acid, plasma     Status: Abnormal   Collection Time: 02/11/18  1:02 PM  Result Value Ref Range   Lactic Acid, Venous 3.6 (HH) 0.5 - 1.9 mmol/L    Comment: CRITICAL RESULT CALLED TO, READ BACK BY AND VERIFIED WITH STEPHANIE RUDD @1344  02/11/18 BY FMW Performed at [facility], [address] [place], [place] [address]   Procalcitonin     Status: None   Collection Time: 02/11/18  1:02 PM  Result Value Ref Range   Procalcitonin 14.44 ng/mL    Comment:        Interpretation: PCT >= 10 ng/mL: Important systemic inflammatory response, almost exclusively due to severe bacterial sepsis or septic shock. (NOTE)       Sepsis PCT Algorithm           Lower Respiratory Tract                                      Infection PCT Algorithm    ----------------------------     ----------------------------  PCT < 0.25 ng/mL                PCT < 0.10 ng/mL         Strongly encourage             Strongly discourage   discontinuation of antibiotics    initiation of antibiotics    ----------------------------     -----------------------------       PCT 0.25 - 0.50 ng/mL            PCT 0.10 - 0.25 ng/mL               OR       >80% decrease in PCT            Discourage initiation of                                            antibiotics      Encourage discontinuation           of antibiotics    ----------------------------     -----------------------------         PCT >= 0.50 ng/mL              PCT 0.26 - 0.50 ng/mL                AND       <80% decrease in PCT             Encourage initiation of                                             antibiotics       Encourage continuation           of antibiotics    ----------------------------     -----------------------------        PCT >= 0.50 ng/mL                  PCT > 0.50 ng/mL               AND         increase in  PCT                  Strongly encourage                                      initiation of antibiotics    Strongly encourage escalation           of antibiotics                                     -----------------------------                                           PCT <= 0.25 ng/mL  OR                                        > 80% decrease in PCT                                     Discontinue / Do not initiate                                             antibiotics Performed at [facility], [address] [place], [place] [address]   ABO/Rh     Status: None   Collection Time: 02/11/18  1:02 PM  Result Value Ref Range   ABO/RH(D)      A POS Performed at [facility], [address] [place], [place] [address]   Glucose, capillary     Status: None   Collection Time: 02/11/18  5:27 PM  Result Value Ref Range   Glucose-Capillary 92 70 - 99 mg/dL  MRSA PCR Screening     Status: None   Collection Time: 02/11/18  5:31 PM  Result Value Ref Range   MRSA by PCR NEGATIVE NEGATIVE    Comment:        The GeneXpert MRSA Assay (FDA approved for NASAL specimens only), is one component of a comprehensive MRSA colonization surveillance program. It is not intended to diagnose MRSA infection nor to guide or monitor treatment for MRSA infections. Performed at [facility], [address] [place], [place] [address]   Lactic acid, plasma     Status: Abnormal   Collection Time: 02/11/18  5:50 PM  Result Value Ref Range   Lactic Acid, Venous 2.2 (HH) 0.5 - 1.9 mmol/L    Comment: CRITICAL RESULT CALLED TO, READ BACK BY AND VERIFIED WITH SANDRA BORBA 02/11/18 @ [place] Performed at [facility], [address] [place], [place] [address]   Basic metabolic panel     Status: Abnormal   Collection Time: 02/12/18  3:49 AM  Result Value Ref Range   Sodium 149 (H) 135 - 145 mmol/L   Potassium 3.3 (L) 3.5  - 5.1 mmol/L   Chloride 115 (H) 98 - 111 mmol/L   CO2 23 22 - 32 mmol/L   Glucose, Bld 96 70 - 99 mg/dL   BUN 39 (H) 8 - 23 mg/dL   Creatinine, Ser 1.79 (H) 0.61 - 1.24 mg/dL   Calcium 9.5 8.9 - 10.3 mg/dL   GFR calc non Af Amer 37 (L) >60 mL/min   GFR calc Af Amer 43 (L) >60 mL/min   Anion gap 11 5 - 15    Comment: Performed at [facility], [address] [place], [place] [address]  CBC     Status: Abnormal   Collection Time: 02/12/18  3:49 AM  Result Value Ref Range   WBC 22.1 (H) 4.0 - 10.5 K/uL   RBC 3.59 (L) 4.22 - 5.81 MIL/uL   Hemoglobin 8.9 (L) 13.0 - 17.0 g/dL   HCT 28.0 (L) 39.0 - 52.0 %   MCV 78.0 (L) 80.0 - 100.0 fL   MCH 24.8 (L) 26.0 - 34.0 pg   MCHC 31.8 30.0 - 36.0 g/dL  RDW 19.8 (H) 11.5 - 15.5 %   Platelets 381 150 - 400 K/uL   nRBC 0.3 (H) 0.0 - 0.2 %    Comment: Performed at [facility], [address] [place], [place] [address]  Procalcitonin     Status: None   Collection Time: 02/12/18  3:49 AM  Result Value Ref Range   Procalcitonin 15.15 ng/mL    Comment:        Interpretation: PCT >= 10 ng/mL: Important systemic inflammatory response, almost exclusively due to severe bacterial sepsis or septic shock. (NOTE)       Sepsis PCT Algorithm           Lower Respiratory Tract                                      Infection PCT Algorithm    ----------------------------     ----------------------------         PCT < 0.25 ng/mL                PCT < 0.10 ng/mL         Strongly encourage             Strongly discourage   discontinuation of antibiotics    initiation of antibiotics    ----------------------------     -----------------------------       PCT 0.25 - 0.50 ng/mL            PCT 0.10 - 0.25 ng/mL               OR       >80% decrease in PCT            Discourage initiation of                                            antibiotics      Encourage discontinuation           of antibiotics    ----------------------------      -----------------------------         PCT >= 0.50 ng/mL              PCT 0.26 - 0.50 ng/mL                AND       <80% decrease in PCT             Encourage initiation of                                             antibiotics       Encourage continuation           of antibiotics    ----------------------------     -----------------------------        PCT >= 0.50 ng/mL                  PCT > 0.50 ng/mL               AND         increase in PCT  Strongly encourage                                      initiation of antibiotics    Strongly encourage escalation           of antibiotics                                     -----------------------------                                           PCT <= 0.25 ng/mL                                                 OR                                        > 80% decrease in PCT                                     Discontinue / Do not initiate                                             antibiotics Performed at [facility], [address] [place], [place] [address]    Dg Chest Portable 1 View  Result Date: 02/11/2018 CLINICAL DATA:  Failure to thrive, loss of 50 pounds over the last year, history of lung carcinoma EXAM: PORTABLE CHEST 1 VIEW COMPARISON:  PET-CT of 12/25/2017 and chest x-ray of 12/14/2017 FINDINGS: The left lower lobe mass appears to have increased in size consistent with progression of lung carcinoma. Also there are several nodular opacities in the right lower lobe most consistent with metastases contra laterally with a probable 1 testis is at the left lung base as well. No pleural effusion is seen. The heart is within normal limits in size. The bones are unremarkable. IMPRESSION: 1. Progression of known left lower lobe malignancy with apparent bilateral lung metastases right more numerous than left by chest x-ray. 2. No pneumonia or effusion is seen. Electronically Signed   By: Ivar Drape M.D.   On:  02/11/2018 12:14    Assessment:  Deovion Batrez is a 72 y.o. 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  PET scan revealed metastatic disease to the liver, lungs, right adrenal gland, and possibly a porta hepatis lymph node. The favored primary sitewasa dominant mass in the left lower lobe. The areas of more diffuse hypermetabolism, including within lung lesions and the posterior right hepatic lobe mass, would be amendable to highest yield sampling. There was trace right pleural fluid and small volume pelvic ascites.  Patient has been hesistant about treatment.  His son wishes him to  pursue therapy.  Symptomatically, he has continued to decline in health.  He needs assistance with baths.  Exam reveals massive hepatomegaly.  Creatinine is 1.82.  Bilirubin is 7.  Plan: 1.  Metastatic cholangiocarcinoma:             Unable to communicate meaningfully with patient regarding his wishes for treatment.  Yesterday he was reluctant.  Spoke at length today with the patient's son, Shane Russo, and Shane Harm, NP.  His performance status has continued to decline.  He has liver and renal dysfunction.  He is not a current candidate for palliative chemotherapy.    We discussed assessment of his biliary tree to r/o obstruction and possible stent.  His son declined interventions per father's previously stated wishes.             We discussed focusing on comfort measures.  The patient's son wanted to ensure his father did not suffer.  2.  Hematology:             Patient with a microcytic anemia.  Etiology secondary to anemia of chronic disease              He is s/p PRBC transfusion 3.  Infection Disease:             Lactic acid elevated.  Patient hypotensive.  Leukocytosis.             Patient currently on Cefepime.             Cultures negative to date. 4.  Code status:              Discuss code status in detail.  Patient's son aware that heroic measures would likely be futile.  Patient previously voiced to me that he would not want heroic measures.  Code status changed to DNR.   Patient's son, Shane Russo, would like to have his sister and other brother, Shane Russo, be able to see their father.  Shane Harm, NP has contacted [facility] for possible release of Shane Russo (unclear if feasible).  Support provided patient's son, Shane Russo.  Dr Rogue Bussing is on call over the weekend.  Please call him for any questions or concerns.   Lequita Asal, MD  02/12/2018, 9:39 PM

## 2018-02-12 NOTE — Progress Notes (Signed)
Follow up - Critical Care Medicine Note  Patient Details:    Shane Russo is an 72 y.o. male.with a PMH of Hypothyroidism, HTN, Tobacco Abuse,ETOH Abuse, OCD, and Anemia. He presented to [facility] ER on 12/5 via EMS with failure to thrive.  Per ER notes within the past year the pt has had unintentional weight loss of 50 lbs.  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  He underwent recent biopsy, however results inconclusive of etiology of the tumor due to large amount of necrosis. Per Palliative Care there was discussion regarding further workup versus transition to hospice care.  In the ER lab results revealed Na+ 149, chloride 112, CO2 20, BUN 37, creatinine 1.82, anion gap 17, alk phos 365, albumin 2.6, AST 105, total bilirubin 7.0,lactic acid 4.9, wbc 24.6, hgb 7.6, PCT 14.4, and UA negative.  CXR revealed progression of known left lower lobe malignancy with bilateral lung metastases, no pneumonia or effusion present. He was hypotensive bp 87/62 and hypothermic temp 94.8 degrees.  He ruled in for sepsis although etiology unknown, therefore  2L NS bolus and iv abx.  He was subsequently admitted to the stepdown unit by hospitalist team for additional workup and treatment.    Lines, Airways, Drains: External Urinary Catheter (Active)  Collection Container Standard drainage bag 02/12/2018  4:15 AM  Securement Method Securing device (Describe) 02/12/2018  4:15 AM    Anti-infectives:  Anti-infectives (From admission, onward)   Start     Dose/Rate Route Frequency Ordered Stop   02/12/18 0000  ceFEPIme (MAXIPIME) 2 g in sodium chloride 0.9 % 100 mL IVPB  Status:  Discontinued     2 g 200 mL/hr over 30 Minutes Intravenous Every 12 hours 02/11/18 1237 02/11/18 1743   02/11/18 2200  vancomycin (VANCOCIN) IVPB 750 mg/150 ml premix     750 mg 150 mL/hr over 60 Minutes Intravenous Every 24  hours 02/11/18 1237     02/11/18 2200  piperacillin-tazobactam (ZOSYN) IVPB 3.375 g     3.375 g 12.5 mL/hr over 240 Minutes Intravenous Every 8 hours 02/11/18 1751     02/11/18 1200  ceFEPIme (MAXIPIME) 2 g in sodium chloride 0.9 % 100 mL IVPB     2 g 200 mL/hr over 30 Minutes Intravenous  Once 02/11/18 1155 02/11/18 1242   02/11/18 1200  metroNIDAZOLE (FLAGYL) IVPB 500 mg  Status:  Discontinued     500 mg 100 mL/hr over 60 Minutes Intravenous Every 8 hours 02/11/18 1155 02/11/18 1753   02/11/18 1200  vancomycin (VANCOCIN) IVPB 1000 mg/200 mL premix     1,000 mg 200 mL/hr over 60 Minutes Intravenous  Once 02/11/18 1155 02/11/18 1344      Microbiology: Results for orders placed or performed during the hospital encounter of 02/11/18  Blood culture (routine x 2)     Status: None (Preliminary result)   Collection Time: 02/11/18 11:38 AM  Result Value Ref Range Status   Specimen Description BLOOD BLOOD LEFT WRIST  Final   Special Requests   Final    BOTTLES DRAWN AEROBIC AND ANAEROBIC Blood Culture results may not be optimal due to an inadequate volume of blood received in culture bottles   Culture   Final    NO GROWTH < 24 HOURS Performed at [facility], [address] [place], [place] [address]    Report Status PENDING  Incomplete  Blood culture (  routine x 2)     Status: None (Preliminary result)   Collection Time: 02/11/18 11:56 AM  Result Value Ref Range Status   Specimen Description BLOOD BLOOD LEFT HAND  Final   Special Requests   Final    BOTTLES DRAWN AEROBIC AND ANAEROBIC Blood Culture adequate volume   Culture   Final    NO GROWTH < 24 HOURS Performed at [facility], [address], [place], [place] [address]    Report Status PENDING  Incomplete  MRSA PCR Screening     Status: None   Collection Time: 02/11/18  5:31 PM  Result Value Ref Range Status   MRSA by PCR NEGATIVE NEGATIVE Final    Comment:        The GeneXpert MRSA Assay (FDA approved  for NASAL specimens only), is one component of a comprehensive MRSA colonization surveillance program. It is not intended to diagnose MRSA infection nor to guide or monitor treatment for MRSA infections. Performed at [facility], [address] [place], [place] [address]    Studies: Dg Chest Portable 1 View  Result Date: 02/11/2018 CLINICAL DATA:  Failure to thrive, loss of 50 pounds over the last year, history of lung carcinoma EXAM: PORTABLE CHEST 1 VIEW COMPARISON:  PET-CT of 12/25/2017 and chest x-ray of 12/14/2017 FINDINGS: The left lower lobe mass appears to have increased in size consistent with progression of lung carcinoma. Also there are several nodular opacities in the right lower lobe most consistent with metastases contra laterally with a probable 1 testis is at the left lung base as well. No pleural effusion is seen. The heart is within normal limits in size. The bones are unremarkable. IMPRESSION: 1. Progression of known left lower lobe malignancy with apparent bilateral lung metastases right more numerous than left by chest x-ray. 2. No pneumonia or effusion is seen. Electronically Signed   By: Ivar Drape M.D.   On: 02/11/2018 12:14    Consults: Treatment Team:  Pccm, Ander Gaster, MD   Subjective:    Overnight Issues: Patient arousable but not responding appropriately.  Presently on vancomycin and Zosyn along with fluid resuscitation  Objective:  Vital signs for last 24 hours: Temp:  [94.8 F (34.9 C)-98.2 F (36.8 C)] 97.5 F (36.4 C) (12/06 0801) Pulse Rate:  [47-104] 104 (12/06 0600) Resp:  [14-34] 34 (12/06 0600) BP: (78-122)/(50-72) 106/68 (12/06 0600) SpO2:  [76 %-100 %] 97 % (12/06 0600) Weight:  [59.6 kg-63.6 kg] 59.6 kg (12/05 1727)  Hemodynamic parameters for last 24 hours:    Intake/Output from previous day: 12/05 0701 - 12/06 0700 In: -  Out: 300 [Urine:300]  Intake/Output this shift: No intake/output data recorded.  Physical  Exam:  Vital signs: Please see the above listed vital signs General: chronically ill cachetic appearing male  Neuro: alert to self only, follows commands, PERRL, sclera jaundiced  HEENT: supple, no JVD  Cardiovascular: nsr, rrr, no R/G  Lungs: clear throughout, even, non labored  Abdomen: hypoactive BS x4, concaved, soft non distended, non tender   Musculoskeletal: cachetic and frail, moves all extremities  Skin: no rashes or lesions present   Assessment/Plan:  Metastatic stage IV adenocarcinoma of unknown primary with widely metastatic lesions to liver, lungs, right adrenal gland and possible porta hepatis lymph node.  Secondary to underlying disease and physiologic status and performance score would be a poor candidate for therapeutic intervention.  Pending oncology.  Has been followed by palliative care with ongoing discussions with family  Shock.  Blood pressure are still marginal although has not had to go on pressors.  Has received fluid resuscitation along with broad-spectrum antibiotics for presumptive sepsis.  Stress dose steroids as patient does have renal lesion  Hypokalemia.  Will replace  Renal insufficiency.  Stage III disease will follow closely  Leukocytosis.  On vancomycin and Zosyn empirically pending results of culture  Anemia.  No evidence of active bleeding  Lactic acidosis noted with a lactic acid of 2.2 coming down from 4.9  Palliative care and goals of care discussions today  Hermelinda Dellen, DO   Critical Care Total Time 35 minutes  Erickson Yamashiro,Harry 02/12/2018  *Care during the described time interval was provided by me and/or other providers on the critical care team.  I have reviewed this patient's available data, including medical history, events of note, physical examination and test results as part of my evaluation.

## 2018-02-12 NOTE — Progress Notes (Signed)
I called and spoke with the case worker Jeananne Rama [phone]) for Shane Russo - patient's other son. Shane Russo is currently incarcerated at [facility]. I explained the condition of patient's status and explored the option of obtaining a temporary release. I was told that Shane Russo would have to personally request this and that his case worker would provide him the information necessary to do so. However, his case worker said that the process would likely take days and there was not way to expedite. Additionally, she said with certainty that Shane Russo would not be approved for release.   I updated Shane Russo (patient's son) with this information. He was tearful but appreciative.

## 2018-02-12 NOTE — Telephone Encounter (Signed)
The MRI can be cancelled. He is transitioning to hospice care.

## 2018-02-13 DIAGNOSIS — E43 Unspecified severe protein-calorie malnutrition: Secondary | ICD-10-CM

## 2018-02-13 LAB — PROCALCITONIN: Procalcitonin: 18.91 ng/mL

## 2018-02-13 MED ORDER — SODIUM CHLORIDE 0.9 % IV BOLUS
1000.0000 mL | Freq: Once | INTRAVENOUS | Status: AC
  Administered 2018-02-13: 1000 mL via INTRAVENOUS

## 2018-02-13 NOTE — Progress Notes (Signed)
[place] at [place] NAME: Shane Russo    MR#:  [id number]  DATE OF BIRTH:  1945-06-05  SUBJECTIVE: Seen at bedside, patient is minimally responsive  CHIEF COMPLAINT:   Chief Complaint  Patient presents with  . Failure To Thrive  According to nurse family is not comfortable with starting comfort care yet.  Patient is n.p.o., unable to get thyroid medicines because patient mental status is not allowing that.  No family at bedside now.  REVIEW OF SYSTEMS:    ROS  Could not be obtained secondary to lethargy  DRUG ALLERGIES:  No Known Allergies  VITALS:  Blood pressure (!) 86/65, pulse 96, temperature 97.7 F (36.5 C), temperature source Oral, resp. rate 20, height 6\' 4"  (1.93 m), weight 59.6 kg, SpO2 97 %.  PHYSICAL EXAMINATION:   Physical Exam  GENERAL:  72 y.o.-year-old patient lying in the bed on oxygen via nasal cannula ,minimally responsive.,  Appears cachectic. EYES: Pupils equal, round, reactive to light and accommodation. No scleral icterus. Extraocular muscles intact.  HEENT: Head atraumatic, normocephalic. Oropharynx and nasopharynx clear.  NECK:  Supple, no jugular venous distention. No thyroid enlargement, no tenderness.  LUNGS: Normal breath sounds bilaterally, no wheezing, rales, rhonchi. No use of accessory muscles of respiration.  CARDIOVASCULAR: S1, S2 tachycardia present. No murmurs, rubs, or gallops.  ABDOMEN: Soft, nontender, nondistended. Bowel sounds present. No organomegaly or mass.  EXTREMITIES: No cyanosis, clubbing or edema b/l.    NEUROLOGIC: Lethargic with decreased responsiveness Unable to perform complete nervous system exam PSYCHIATRIC: The patient is alert and oriented x none SKIN: No obvious rash, lesion, or ulcer.   LABORATORY PANEL:   CBC Recent Labs  Lab 02/12/18 0349  WBC 22.1*  HGB 8.9*  HCT 28.0*  PLT 381    ------------------------------------------------------------------------------------------------------------------ Chemistries  Recent Labs  Lab 02/11/18 1138 02/12/18 0349  NA 149* 149*  K 3.6 3.3*  CL 112* 115*  CO2 20* 23  GLUCOSE 84 96  BUN 37* 39*  CREATININE 1.82* 1.79*  CALCIUM 10.2 9.5  AST 105*  --   ALT 29  --   ALKPHOS 365*  --   BILITOT 7.0*  --    ------------------------------------------------------------------------------------------------------------------  Cardiac Enzymes No results for input(s): TROPONINI in the last 168 hours. ------------------------------------------------------------------------------------------------------------------  RADIOLOGY:  Dg Chest Portable 1 View  Result Date: 02/11/2018 CLINICAL DATA:  Failure to thrive, loss of 50 pounds over the last year, history of lung carcinoma EXAM: PORTABLE CHEST 1 VIEW COMPARISON:  PET-CT of 12/25/2017 and chest x-ray of 12/14/2017 FINDINGS: The left lower lobe mass appears to have increased in size consistent with progression of lung carcinoma. Also there are several nodular opacities in the right lower lobe most consistent with metastases contra laterally with a probable 1 testis is at the left lung base as well. No pleural effusion is seen. The heart is within normal limits in size. The bones are unremarkable. IMPRESSION: 1. Progression of known left lower lobe malignancy with apparent bilateral lung metastases right more numerous than left by chest x-ray. 2. No pneumonia or effusion is seen. Electronically Signed   By: Ivar Drape M.D.   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  Family not decided about full comfort care measures. Cultures have been negative to date. -Renal failure CKD stage III mild   -Palliative care discussions and goals of care discussed with family Patient was made DNR  -We will talk to family again to see if we can switch him to full comfort measures. Hospice consulted, no hospice beds yet. All the records are reviewed and case discussed with Care Management/Social Worker. Management plans discussed with the patient, family and they are in agreement.  CODE STATUS: DNR  DVT Prophylaxis: SCDs  TOTAL TIME TAKING CARE OF THIS PATIENT: 35 minutes.   Epifanio Lesches M.D on 02/13/2018 at 9:07 AM  Between 7am to 6pm - Pager - [phone]  After 6pm go to [email or web address] - password [email or web address] [facility]  Office  [phone]  CC: Primary care physician; Patient, No Pcp Per  Note: This dictation was prepared with Dragon dictation along with smaller phrase technology. Any transcriptional errors that result from this process are unintentional.

## 2018-02-13 NOTE — Progress Notes (Signed)
   02/13/18 1900  Clinical Encounter Type  Visited With Patient  Visit Type Other (Comment);Initial (Rapid Response)  Referral From Nurse  Recommendations Follow-up, as requested.  Spiritual Encounters  Spiritual Needs Ritual;Prayer   Chaplain responded to Rapid Response and offered pastoral presence, emotional support, and prayer for both patient and care team.

## 2018-02-13 NOTE — Progress Notes (Signed)
Dr Jerelyn Charles notified that patient has blood pressure of 75/39. MD order to bolus 1000 ml.

## 2018-02-13 NOTE — Significant Event (Signed)
Rapid Response Event Note  Overview: Time Called: 1715 Arrival Time: 1718 Event Type: Hypotension  Initial Focused Assessment: called for RR on hypotensive Pt, HX CA with mets   Interventions:Dr Salary notified and told RN Deidra to give 1 Liter NS bolus  Plan of Care (if not transferred): Deidra to call if further assistance needed.  Event Summary: Name of Physician Notified: Montell Salary at 1720    at    Outcome: Stayed in room and stabalized  Event End Time: [place]

## 2018-02-14 MED ORDER — KCL-LACTATED RINGERS-D5W 20 MEQ/L IV SOLN
INTRAVENOUS | Status: AC
  Administered 2018-02-14: 06:00:00 via INTRAVENOUS
  Filled 2018-02-14: qty 1000

## 2018-02-14 MED ORDER — LACTATED RINGERS IV BOLUS
1000.0000 mL | Freq: Once | INTRAVENOUS | Status: AC
  Administered 2018-02-14: 07:00:00 1000 mL via INTRAVENOUS

## 2018-02-14 MED ORDER — KCL-LACTATED RINGERS-D5W 20 MEQ/L IV SOLN
INTRAVENOUS | Status: DC
  Filled 2018-02-14 (×2): qty 1000

## 2018-02-14 MED ORDER — MORPHINE SULFATE (PF) 2 MG/ML IV SOLN
2.0000 mg | INTRAVENOUS | Status: DC | PRN
  Administered 2018-02-14 (×3): 2 mg via INTRAVENOUS
  Filled 2018-02-14 (×3): qty 1

## 2018-02-14 NOTE — Progress Notes (Signed)
[place] at [place] NAME: Shane Russo    MR#:  [id number]  DATE OF BIRTH:  [birth year]  SUBJECTIVE: Seen at bedside, patient is minimally responsive  CHIEF COMPLAINT:   Chief Complaint  Patient presents with  . Failure To Thrive  Spoke to son, wanted full comfort measures.    REVIEW OF SYSTEMS:    ROS  Could not be obtained secondary to lethargy  DRUG ALLERGIES:  No Known Allergies  VITALS:  Blood pressure (!) 116/25, pulse 89, temperature 97.7 F (36.5 C), resp. rate 18, height 6\' 4"  (1.93 m), weight 59.6 kg, SpO2 96 %.  PHYSICAL EXAMINATION:   Physical Exam  GENERAL:  72 y.o.-year-old patient lying in the bed on oxygen via nasal cannula ,minimally responsive.,  Appears cachectic. EYES: Pupils equal, round, reactive to light and accommodation. No scleral icterus. Extraocular muscles intact.  HEENT: Head atraumatic, normocephalic. Oropharynx and nasopharynx clear.  NECK:  Supple, no jugular venous distention. No thyroid enlargement, no tenderness.  LUNGS: Normal breath sounds bilaterally, no wheezing, rales, rhonchi. No use of accessory muscles of respiration.  CARDIOVASCULAR: S1, S2 tachycardia present. No murmurs, rubs, or gallops.  ABDOMEN: Patient appears to have pain,moaning when I palpated the abdomen. EXTREMITIES: No cyanosis, clubbing or edema b/l.    NEUROLOGIC: Lethargic with decreased responsiveness Unable to perform complete nervous system exam PSYCHIATRIC: The patient is alert and oriented x none SKIN: No obvious rash, lesion, or ulcer.   LABORATORY PANEL:   CBC Recent Labs  Lab 02/12/18 0349  WBC 22.1*  HGB 8.9*  HCT 28.0*  PLT 381   ------------------------------------------------------------------------------------------------------------------ Chemistries  Recent Labs  Lab 02/11/18 1138 02/12/18 0349  NA 149* 149*  K 3.6 3.3*  CL 112* 115*  CO2 20* 23  GLUCOSE 84 96  BUN 37* 39*  CREATININE  1.82* 1.79*  CALCIUM 10.2 9.5  AST 105*  --   ALT 29  --   ALKPHOS 365*  --   BILITOT 7.0*  --    ------------------------------------------------------------------------------------------------------------------  Cardiac Enzymes No results for input(s): TROPONINI in the last 168 hours. ------------------------------------------------------------------------------------------------------------------  RADIOLOGY:  No results found.   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  Spoke to son at bedside at length, he wants to pursue full comfort measures with morphine, Ativan, no blood work, IV antibiotics.  Will change vitals to daily.  Will put full comfort care orders,  nurse may pronounce in the computer.  Son is agreeable for this.  He told me that he does not want his dad to suffer anymore.. -Renal failure CKD stage III mild    spoke to  patient's registered nurse Colletta [place].   - All the records are reviewed and case discussed with Care Management/Social Worker. Management plans discussed with the patient, family and they are in agreement.  CODE STATUS: DNR  DVT Prophylaxis: SCDs  TOTAL TIME TAKING CARE OF THIS PATIENT: 35 minutes.   Epifanio Lesches M.D on 02/14/2018 at 9:19 AM  Between 7am to 6pm - Pager - [phone]  After 6pm go to [email or web address] -  password [email or web address] [facility]  Office  [phone]  CC: Primary care physician; Patient, No Pcp Per  Note:  This dictation was prepared with Dragon dictation along with smaller phrase technology. Any transcriptional errors that result from this process are unintentional.

## 2018-02-14 NOTE — Clinical Social Work Note (Signed)
The CSW has updated the hospice home admissions coordinator of declaration of comfort care. The CSW will update if a bed comes available.  Santiago Bumpers, MSW, Latanya Presser [phone]

## 2018-02-14 NOTE — Plan of Care (Signed)

## 2018-02-14 NOTE — Progress Notes (Signed)
MD notified of BP 68/52 new orders given. Pt alert and denies discomfort. Will continue to monitor.

## 2018-02-14 NOTE — Progress Notes (Signed)
Pt BP 65/51 Dr. Aliene Altes notified, new orders given. Pt denies dizziness, headache, and any feelings of "change". Will continue to monitor.

## 2018-02-15 ENCOUNTER — Ambulatory Visit: Payer: Medicare Other

## 2018-02-15 DIAGNOSIS — C229 Malignant neoplasm of liver, not specified as primary or secondary: Secondary | ICD-10-CM

## 2018-02-15 MED ORDER — MORPHINE SULFATE 20 MG/5ML PO SOLN: 5.0000 mg | ORAL | 0 refills | Status: AC | PRN

## 2018-02-15 MED ORDER — LORAZEPAM 2 MG/ML PO CONC: 1.0000 mg | ORAL | 0 refills | Status: AC | PRN

## 2018-02-15 NOTE — Clinical Social Work Note (Signed)
CSW spoke with Santiago Glad, hospice liaison and they do have a bed available for patient today. Son is in agreement with discharge to hospice home today. Santiago Glad, hospice liaison will call report and call for transport.   [place], [place]

## 2018-02-15 NOTE — Progress Notes (Signed)
[facility]  Telephone:([phone] [phone]   Name: Shane Russo Date: 03/05/2018 MRN: [id number]  DOB: [birth year]  Patient Care Team: Patient, No Pcp Per as PCP - General (General Practice) Spaete, Lenetta Quaker, MD as Consulting Physician (Gastroenterology)    REASON FOR CONSULTATION: Palliative Care consult requested for this 72 y.o. 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  PMH also notable for tobacco abuse, hypothyroidism, AOCD, history of alcohol abuse.  He underwent recent biopsy but results were inconclusive of etiology of the tumor due to large amount of necrosis.  Patient was noted to have poor oral intake with rapid weight loss and declining performance status.  There was discussion about further work-up versus transition to hospice care.  Patient is now admitted on 02/11/2018 with failure to thrive and probable sepsis.  Source of sepsis is unclear but the patient is hypotensive with hypernatremia and acute renal failure.  Palliative care was consulted to help address goals.  CODE STATUS: DNR  PAST MEDICAL HISTORY: Past Medical History:  Diagnosis Date  . Anemia   . Hypertension   . Thyroid disease     PAST SURGICAL HISTORY:  Past Surgical History:  Procedure Laterality Date  . ESOPHAGOGASTRODUODENOSCOPY (EGD) WITH PROPOFOL N/A 12/29/2017   Procedure: ESOPHAGOGASTRODUODENOSCOPY (EGD) WITH PROPOFOL;  Surgeon: Lin Landsman, MD;  Location: [facility];  Service: Gastroenterology;  Laterality: N/A;  . none      HEMATOLOGY/ONCOLOGY HISTORY:   No history exists.    ALLERGIES:  has No Known Allergies.  MEDICATIONS:  Current Facility-Administered Medications  Medication Dose Route Frequency Provider Last Rate Last Dose  . 0.9 %  sodium chloride infusion  10 mL/hr Intravenous  Once Merlyn Lot, MD   Stopped at 03/04/2018 1124  . acetaminophen (TYLENOL) tablet 650 mg  650 mg Oral Q6H PRN Henreitta Leber, MD       Or  . acetaminophen (TYLENOL) suppository 650 mg  650 mg Rectal Q6H PRN Henreitta Leber, MD      . dronabinol (MARINOL) capsule 2.5 mg  2.5 mg Oral BID AC Sainani, Belia Heman, MD      . LORazepam (ATIVAN) injection 2 mg  2 mg Intravenous Q4H PRN Bradly Bienenstock, NP   2 mg at 02/14/18 2212  . morphine 2 MG/ML injection 2 mg  2 mg Intravenous Q4H PRN Epifanio Lesches, MD   2 mg at 02/14/18 1855  . nicotine (NICODERM CQ - dosed in mg/24 hours) patch 14 mg  14 mg Transdermal Daily Darel Hong D, NP   14 mg at 02/28/2018 1017  . ondansetron (ZOFRAN) tablet 4 mg  4 mg Oral Q6H PRN Henreitta Leber, MD       Or  . ondansetron (ZOFRAN) injection 4 mg  4 mg Intravenous Q6H PRN Henreitta Leber, MD       Current Outpatient Medications  Medication Sig Dispense Refill  . dronabinol (MARINOL) 2.5 MG capsule Take 1 capsule (2.5 mg total) by mouth 2 (two) times daily before a meal. 60 capsule 0  . LORazepam (ATIVAN) 2 MG/ML concentrated solution Take 0.5 mLs (1 mg total) by mouth every 4 (four) hours as needed for anxiety or sleep. 30 mL 0  . morphine 20 MG/5ML solution Take 1.3 mLs (5.2 mg total) by mouth every 2 (two) hours as needed for pain (shortness of  breath). 30 mL 0    VITAL SIGNS: BP (!) 62/41 (BP Location: Left Arm)   Pulse 64   Temp 97.9 F (36.6 C) (Oral)   Resp 19   Ht 6\' 4"  (1.93 m)   Wt 131 lb 6.3 oz (59.6 kg)   SpO2 92%   BMI 15.99 kg/m  Filed Weights   02/11/18 1046 02/11/18 1727  Weight: 140 lb 2 oz (63.6 kg) 131 lb 6.3 oz (59.6 kg)    Estimated body mass index is 15.99 kg/m as calculated from the following:   Height as of this encounter: 6\' 4"  (1.93 m).   Weight as of this encounter: 131 lb 6.3 oz (59.6 kg).  LABS: CBC:    Component Value Date/Time   WBC 22.1 (H) 02/12/2018 0349   HGB 8.9 (L) 02/12/2018 0349   HCT 28.0  (L) 02/12/2018 0349   PLT 381 02/12/2018 0349   MCV 78.0 (L) 02/12/2018 0349   NEUTROABS 21.5 (H) 02/11/2018 1138   LYMPHSABS 1.1 02/11/2018 1138   MONOABS 1.4 (H) 02/11/2018 1138   EOSABS 0.1 02/11/2018 1138   BASOSABS 0.1 02/11/2018 1138   Comprehensive Metabolic Panel:    Component Value Date/Time   NA 149 (H) 02/12/2018 0349   K 3.3 (L) 02/12/2018 0349   CL 115 (H) 02/12/2018 0349   CO2 23 02/12/2018 0349   BUN 39 (H) 02/12/2018 0349   CREATININE 1.79 (H) 02/12/2018 0349   GLUCOSE 96 02/12/2018 0349   CALCIUM 9.5 02/12/2018 0349   AST 105 (H) 02/11/2018 1138   ALT 29 02/11/2018 1138   ALKPHOS 365 (H) 02/11/2018 1138   BILITOT 7.0 (H) 02/11/2018 1138   PROT 7.4 02/11/2018 1138   ALBUMIN 2.6 (L) 02/11/2018 1138    RADIOGRAPHIC STUDIES: Dg Chest Portable 1 View  Result Date: 02/11/2018 CLINICAL DATA:  Failure to thrive, loss of 50 pounds over the last year, history of lung carcinoma EXAM: PORTABLE CHEST 1 VIEW COMPARISON:  PET-CT of 12/25/2017 and chest x-ray of 12/14/2017 FINDINGS: The left lower lobe mass appears to have increased in size consistent with progression of lung carcinoma. Also there are several nodular opacities in the right lower lobe most consistent with metastases contra laterally with a probable 1 testis is at the left lung base as well. No pleural effusion is seen. The heart is within normal limits in size. The bones are unremarkable. IMPRESSION: 1. Progression of known left lower lobe malignancy with apparent bilateral lung metastases right more numerous than left by chest x-ray. 2. No pneumonia or effusion is seen. Electronically Signed   By: Ivar Drape M.D.   On: 02/11/2018 12:14    PERFORMANCE STATUS (ECOG) : 4 - Bedbound  Review of Systems As noted above. Otherwise, a complete review of systems is negative.  Physical Exam General: Critically ill-appearing Cardiovascular: regular rate and rhythm Pulmonary: clear ant fields Abdomen:  Firm Extremities: no edema Skin: no rashes Neurological: unresponsive  IMPRESSION: Weekend notes reviewed. Patient now comfort care. He is pending transfer to the [facility] later today.   Patient is comfortable appearing. No family present.   PLAN: [place] [facility]  Time Total: 15 minutes  Visit consisted of counseling and education dealing with the complex and emotionally intense issues of symptom management and palliative care in the setting of serious and potentially life-threatening illness.Greater than 50%  of this time was spent counseling and coordinating care related to the above assessment and plan.  Signed by: Altha Harm, PhD,  NP-C [phone] (Work Cell)

## 2018-02-15 NOTE — Discharge Summary (Signed)
Shane Russo at [facility] NAME: Shane Russo    MR#:  [id number]  DATE OF BIRTH:  02/26/1946  DATE OF ADMISSION:  02/11/2018   ADMITTING PHYSICIAN: Henreitta Leber, MD  DATE OF DISCHARGE: 02/21/2018  PRIMARY CARE PHYSICIAN: Shane Russo, No Pcp Per   ADMISSION DIAGNOSIS:  Malignant neoplasm of liver, unspecified liver malignancy type ([place]) [C22.9] Sepsis, due to unspecified organism, unspecified whether acute organ dysfunction present ([place]) [A41.9] Shock ([place]) [R57.9] DISCHARGE DIAGNOSIS:  Active Problems:   Palliative care encounter   Acute renal failure (ARF) ([place])   Shock ([place])   Protein-calorie malnutrition, severe  SECONDARY DIAGNOSIS:   Past Medical History:  Diagnosis Date  . Anemia   . Hypertension   . Thyroid disease    HOSPITAL COURSE:   Shane Russo is a 72 year old male who presented to the ED with weakness, lethargy, and poor po intake. He was found to have an AKI and had suspected sepsis due to leukocytosis, lactic acidosis, and hypotension. He was admitted for further management. He was given IV antibiotics, IV fluids, and stress dose steroids, with minimal improvement in his blood pressures. Sepsis was ultimately ruled out with negative blood and urine cultures. 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 Shane Russo was transitioned to full comfort medicines and was discharged to residential hospice.  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 Adult failure to thrive Anemia of chronic disease CONSULTS OBTAINED:  Treatment Team:  Sela Hua, MD DRUG ALLERGIES:  No Known Allergies DISCHARGE MEDICATIONS:   Allergies as of 02/11/2018   No Known Allergies     Medication List    STOP taking these medications   folic acid 1 MG  tablet Commonly known as:  FOLVITE   levothyroxine 25 MCG tablet Commonly known as:  SYNTHROID, LEVOTHROID   multivitamin Tabs tablet   ondansetron 4 MG tablet Commonly known as:  ZOFRAN     TAKE these medications   dronabinol 2.5 MG capsule Commonly known as:  MARINOL Take 1 capsule (2.5 mg total) by mouth 2 (two) times daily before a meal.   LORazepam 2 MG/ML concentrated solution Commonly known as:  ATIVAN Take 0.5 mLs (1 mg total) by mouth every 4 (four) hours as needed for anxiety or sleep.   morphine 20 MG/5ML solution Take 1.3 mLs (5.2 mg total) by mouth every 2 (two) hours as needed for pain (shortness of breath).        DISCHARGE INSTRUCTIONS:  1. Discharge to residential hospice DIET:  Ad lib DISCHARGE CONDITION:  Critical ACTIVITY:  Activity as tolerated OXYGEN:  Home Oxygen: No.  Oxygen Delivery: room air DISCHARGE LOCATION:  Residential hospice   If you experience worsening of your admission symptoms, develop shortness of breath, life threatening emergency, suicidal or homicidal thoughts you must seek medical attention immediately by calling 911 or calling your MD immediately  if symptoms less severe.  You Must read complete instructions/literature along with all the possible adverse reactions/side effects for all the Medicines you take and that have been prescribed to you. Take any new Medicines after you have completely understood and accpet all the possible adverse reactions/side effects.   Please note  You were cared for by a hospitalist during your hospital stay. If you have any questions about your discharge medications or the  care you received while you were in the hospital after you are discharged, you can call the unit and asked to speak with the hospitalist on call if the hospitalist that took care of you is not available. Once you are discharged, your primary care physician will handle any further medical issues. Please note that NO REFILLS for  any discharge medications will be authorized once you are discharged, as it is imperative that you return to your primary care physician (or establish a relationship with a primary care physician if you do not have one) for your aftercare needs so that they can reassess your need for medications and monitor your lab values.    On the day of Discharge:  VITAL SIGNS:  Blood pressure (!) 79/54, pulse 94, temperature (!) 97.4 F (36.3 C), temperature source Oral, resp. rate 19, height 6\' 4"  (1.93 m), weight 59.6 kg, SpO2 92 %. PHYSICAL EXAMINATION:  GENERAL:  72 y.o.-year-old Shane Russo lying in the bed with no acute distress. Resting comfortably. EYES: Pupils equal, round, reactive to light and accommodation. No scleral icterus. Extraocular muscles intact.  HEENT: Head atraumatic, normocephalic. Oropharynx and nasopharynx clear. Dry mucous membranes. NECK:  Supple, no jugular venous distention. No thyroid enlargement, no tenderness.  LUNGS: Normal breath sounds bilaterally, no wheezing, rales,rhonchi or crepitation. No use of accessory muscles of respiration.  CARDIOVASCULAR: Bradycardic, regular rhythm, S1, S2 normal. No murmurs, rubs, or gallops.  ABDOMEN: Soft, non-tender, non-distended. Bowel sounds present. No organomegaly or mass.  EXTREMITIES: No pedal edema, cyanosis, or clubbing.  NEUROLOGIC: Opens eyes briefly to voice, does not follow commands. PSYCHIATRIC: unable to assess  SKIN: No obvious rash, lesion, or ulcer.  DATA REVIEW:   CBC Recent Labs  Lab 02/12/18 0349  WBC 22.1*  HGB 8.9*  HCT 28.0*  PLT 381    Chemistries  Recent Labs  Lab 02/11/18 1138 02/12/18 0349  NA 149* 149*  K 3.6 3.3*  CL 112* 115*  CO2 20* 23  GLUCOSE 84 96  BUN 37* 39*  CREATININE 1.82* 1.79*  CALCIUM 10.2 9.5  AST 105*  --   ALT 29  --   ALKPHOS 365*  --   BILITOT 7.0*  --      Microbiology Results  Results for orders placed or performed during the hospital encounter of 02/11/18   Blood culture (routine x 2)     Status: None (Preliminary result)   Collection Time: 02/11/18 11:38 AM  Result Value Ref Range Status   Specimen Description BLOOD BLOOD LEFT WRIST  Final   Special Requests   Final    BOTTLES DRAWN AEROBIC AND ANAEROBIC Blood Culture results may not be optimal due to an inadequate volume of blood received in culture bottles   Culture   Final    NO GROWTH 4 DAYS Performed at [facility], [address] [place], [place] [address]    Report Status PENDING  Incomplete  Blood culture (routine x 2)     Status: None (Preliminary result)   Collection Time: 02/11/18 11:56 AM  Result Value Ref Range Status   Specimen Description BLOOD BLOOD LEFT HAND  Final   Special Requests   Final    BOTTLES DRAWN AEROBIC AND ANAEROBIC Blood Culture adequate volume   Culture   Final    NO GROWTH 4 DAYS Performed at [facility], [address], [place], [place] [address]    Report Status PENDING  Incomplete  Urine culture     Status: None  Collection Time: 02/11/18 12:16 PM  Result Value Ref Range Status   Specimen Description   Final    URINE, RANDOM Performed at [facility], [address], [place], [place] [address]    Special Requests   Final    NONE Performed at [facility], [address], [place], [place] [address]    Culture   Final    NO GROWTH Performed at [facility], [place] [address] [place], [place] [address]    Report Status 02/12/2018 FINAL  Final  MRSA PCR Screening     Status: None   Collection Time: 02/11/18  5:31 PM  Result Value Ref Range Status   MRSA by PCR NEGATIVE NEGATIVE Final    Comment:        The GeneXpert MRSA Assay (FDA approved for NASAL specimens only), is one component of a comprehensive MRSA colonization surveillance program. It is not intended to diagnose MRSA infection nor to guide or monitor treatment for MRSA infections. Performed at [facility], [address] [place], [place] [address]     RADIOLOGY:  No results found.   Management plans discussed with the Shane Russo, family and they are in agreement.  CODE STATUS: DNR   TOTAL TIME TAKING CARE OF THIS Shane Russo: 45 minutes.    Berna Spare Mayo M.D on 02/19/2018 at 10:27 AM  Between 7am to 6pm - Pager - [phone]  After 6pm go to [email or web address] - Proofreader  [facility]  Office  [phone]  CC: Primary care physician; Shane Russo, No Pcp Per   Note: This dictation was prepared with Dragon dictation along with smaller phrase technology. Any transcriptional errors that result from this process are unintentional.

## 2018-02-15 NOTE — Care Management Important Message (Signed)
Important Message  Patient Details  Name: Shane Russo MRN: [id number] Date of Birth: April 12, 1945   Medicare Important Message Given:  Yes    Juliann Pulse A Avryl Roehm 02/25/2018, 10:14 AM

## 2018-02-15 NOTE — Progress Notes (Signed)
Follow up visit made to new hospice home referral. Shane Russo and his fiance [name] at bedside. Patient appeared some whatt restless, resistant to mouth care. Plan is for discharge today to the hospice home. Writer provided education regarding hospice services, philosophy and team approach to care with understanding voiced. Questions answered, consents signed. At time of visit patient appears stable for transport. Report called to the hospice home, EMS notified for transport, Hospital care team updated. Discharge summary and consents faxed to referral. Thank you. Flo Shanks RN, BSN, [facility], hospital liaison [phone]

## 2018-02-16 LAB — CULTURE, BLOOD (ROUTINE X 2)
CULTURE: NO GROWTH
Culture: NO GROWTH
SPECIAL REQUESTS: ADEQUATE

## 2018-03-10 DEATH — deceased

## 2020-02-27 IMAGING — CT CT ABD-PELV W/ CM
2 of 5 series · 14 of 46 positions shown, 16 images · IV contrast (APPLIED)
Comparison: None.

CLINICAL DATA: Low hemoglobin, elevated white blood cell count,
weakness and severe weight loss

EXAM:
CT ABDOMEN AND PELVIS WITH CONTRAST
TECHNIQUE: Multidetector CT imaging of the abdomen and pelvis was performed
using the standard protocol following bolus administration of
intravenous contrast.
CONTRAST:  100mL MVFGWE-YGG IOPAMIDOL (MVFGWE-YGG) INJECTION 61%

[Series 2: routine abd/pel with · axial · 0.77mm/px · z∈[-1064,-614]mm · 11 of 100 slices shown, 13 images]
[im 5/100  soft-tissue]
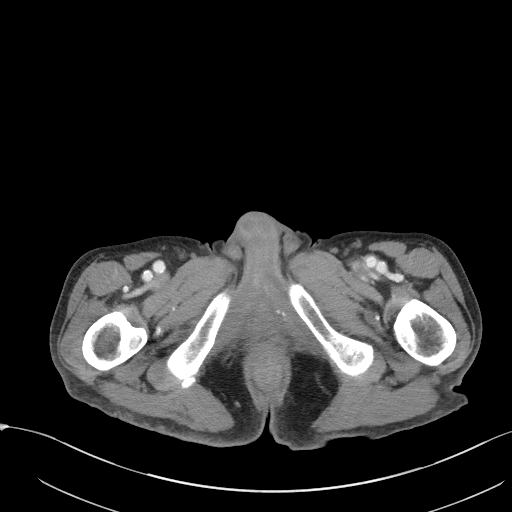
[im 5/100  bone]
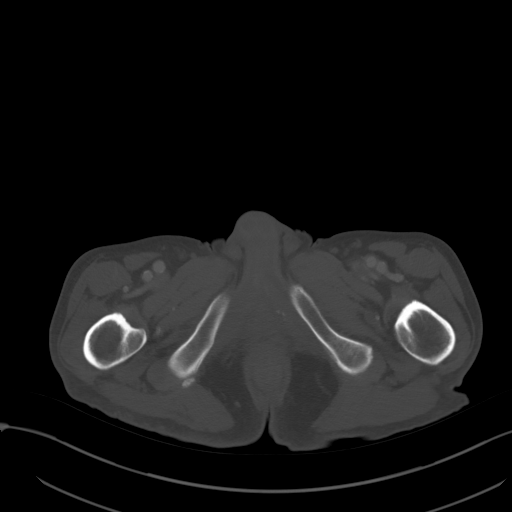
[im 15/100  soft-tissue]
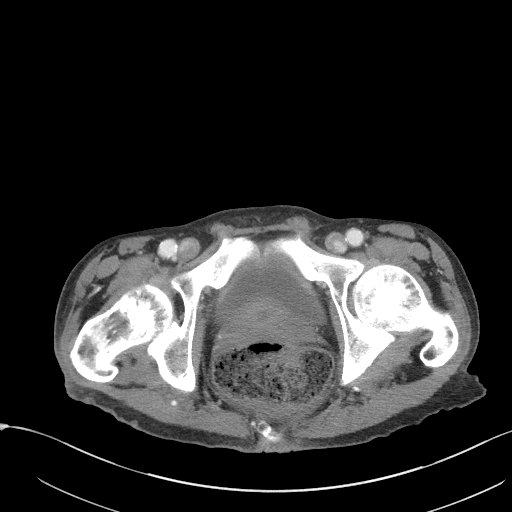
[im 25/100  soft-tissue]
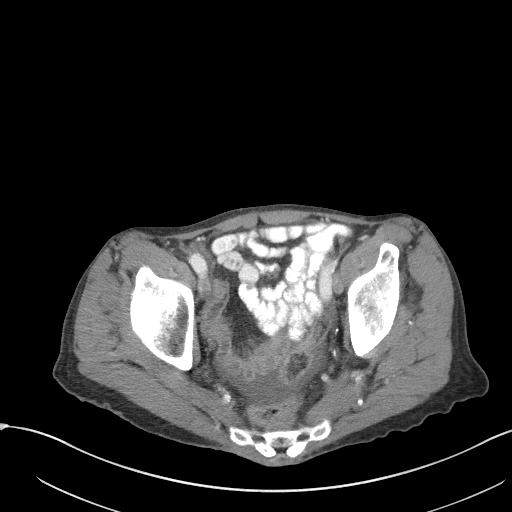
[im 35/100  soft-tissue]
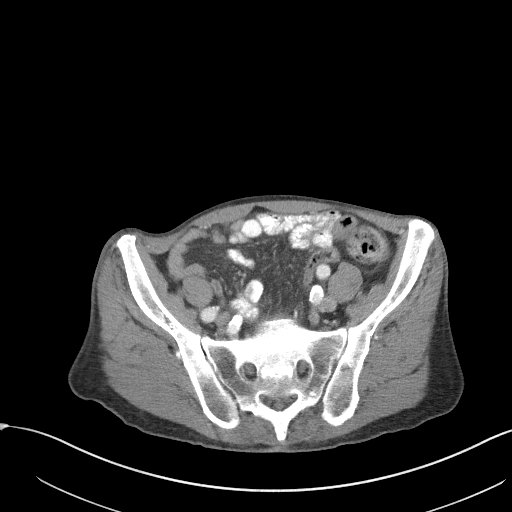
[im 40/100  soft-tissue]
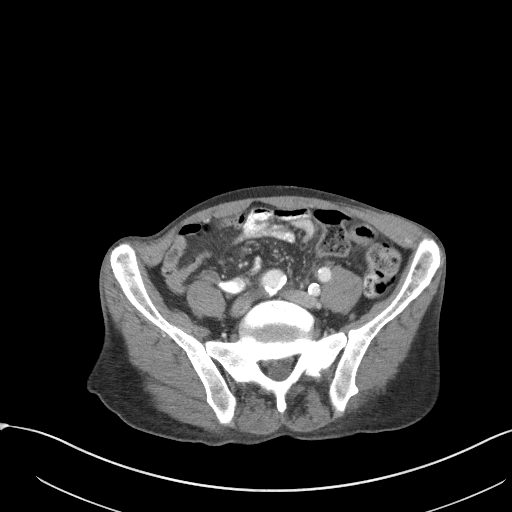
[im 50/100  soft-tissue]
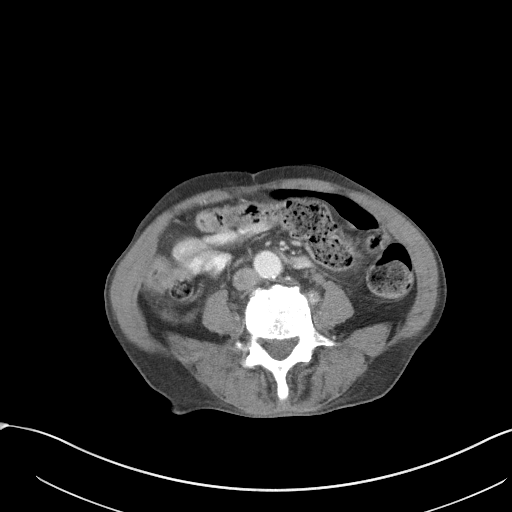
[im 60/100  soft-tissue]
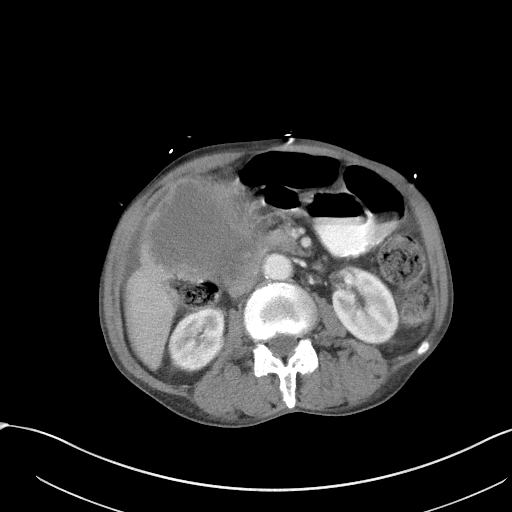
[im 65/100  soft-tissue]
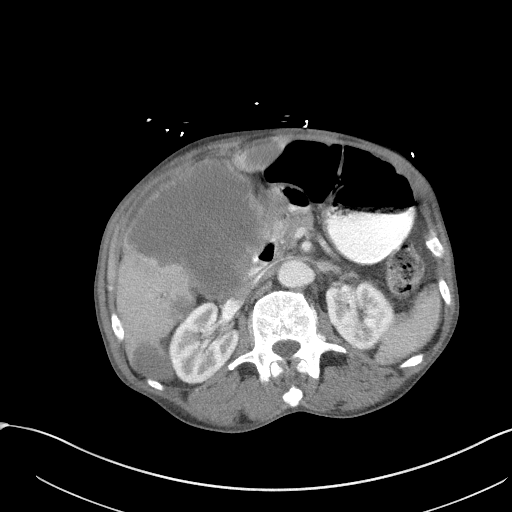
[im 75/100  soft-tissue]
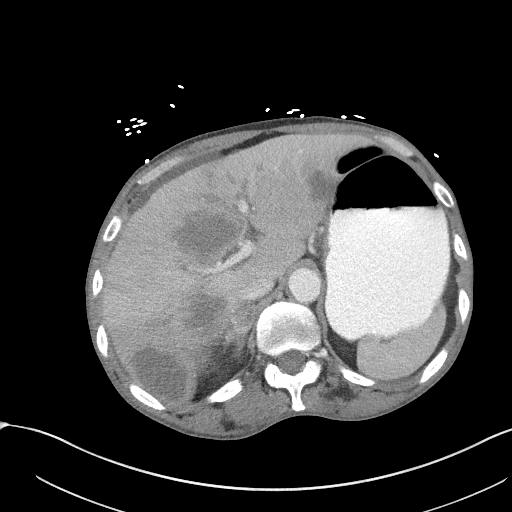
[im 75/100  bone]
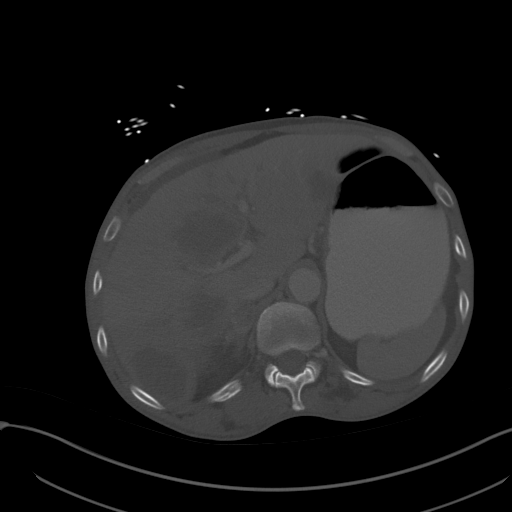
[im 85/100  soft-tissue]
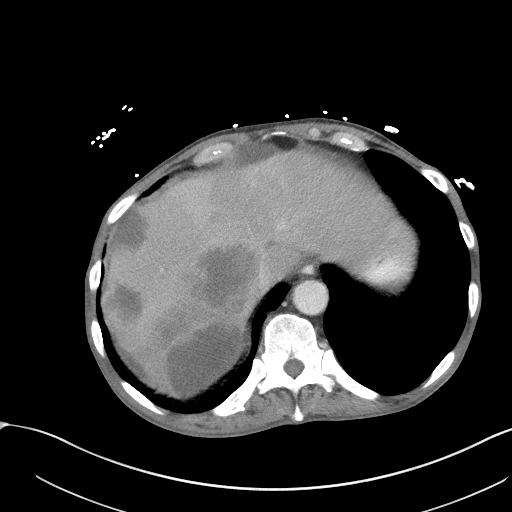
[im 95/100  soft-tissue]
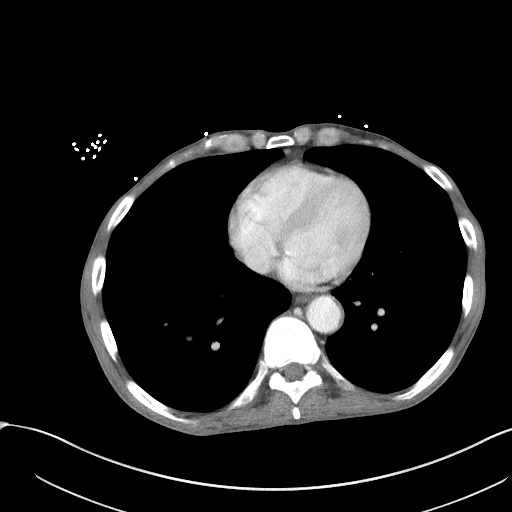

[Series 5: coronal st · coronal · 0.62mm/px · 3 of 89 slices shown]
[im 30/89  soft-tissue]
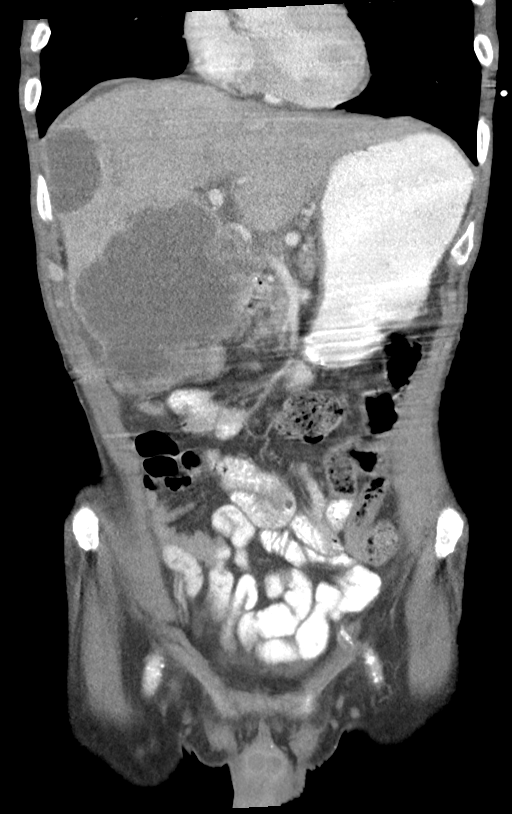
[im 40/89  soft-tissue]
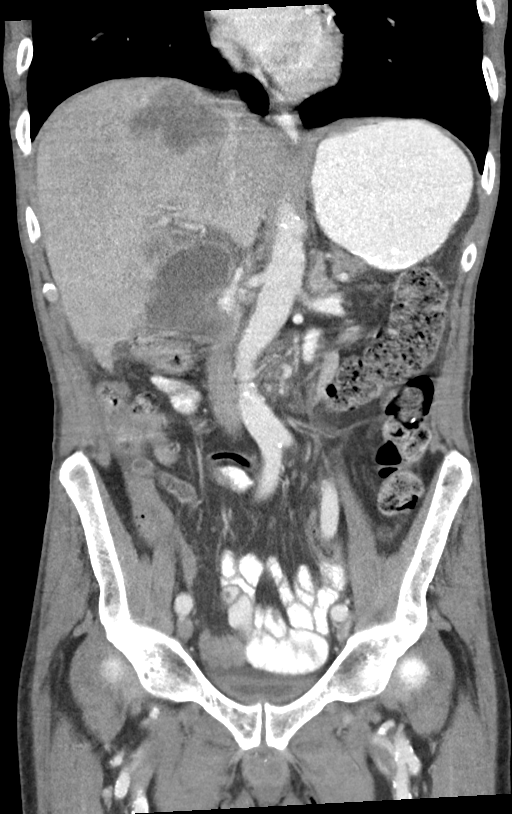
[im 49/89  soft-tissue]
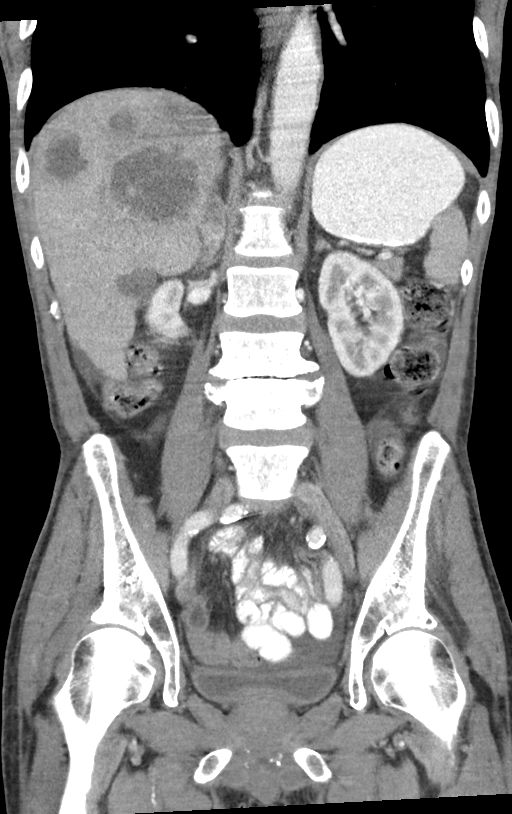

[14 of 46 positions shown; findings below may reference images not displayed]

FINDINGS: Lower chest: Multiple lung nodules are present at the lung bases
left-greater-than-right most consistent with metastatic involvement
of the lungs. No pleural effusion is seen. The heart is within
normal limits in size. No pericardial effusion is evident.

Hepatobiliary: There are multiple lesions throughout both the right
and left lobes of liver of varying sizes, consistent with diffuse
metastatic involvement of the liver. One of the largest lesions
involves the medial segment of the left lobe measuring approximately
9.4 x 9.8 cm. No intrahepatic ductal dilatation is seen. Gallbladder
is not visualized.

Pancreas: The pancreas appears atrophic. The pancreatic duct is not
dilated.

Spleen: The spleen is unremarkable.

Adrenals/Urinary Tract: The adrenal glands appear normal. The
kidneys enhance with no focal abnormality and no calculi are
evident. On delayed images, the pelvocaliceal systems appear normal.
No hydronephrosis is seen. The ureters appear normal in caliber. The
urinary bladder is decompressed and cannot be evaluated.

Stomach/Bowel: The stomach is very distended with oral contrast and
air. No definite abnormality of the small bowel is seen. There is a
moderate amount of feces particularly in the rectosigmoid region. No
edema of the rectal mucosa is evident. The hepatic flexure of colon
is deviated medially by the large hepatic lesion. The terminal ileum
and the appendix appear unremarkable.

Vascular/Lymphatic: The abdominal aorta is normal in caliber with
moderate abdominal aortic atherosclerosis. No adenopathy is seen.

Reproductive: The prostate is unremarkable.

Other: No abdominal wall hernia is seen. There is a small amount of
ascites present around the liver dependently layering within the
pelvis.

Musculoskeletal: There is degenerative disc disease at L3-4 with
considerable loss of disc space and slight anterolisthesis of L3 on
L4. No lytic or blastic lesion is evident.
IMPRESSION: 1. Multiple liver lesions throughout both the right and left lobes
consistent with liver metastasis. Multicentric hepatoma cannot be
excluded.
2. Multiple lung nodules at the lung bases left-greater-than-right
are consistent with metastatic involvement of the lungs. Chest x-ray
or CT of the chest would be helpful to assess for possible primary
lesion.
3. Small amount of ascites.

## 2020-04-26 IMAGING — DX DG CHEST 1V PORT
1 series · 1 of 1 positions shown · non-contrast
Comparison: PET-CT of 12/25/2017 and chest x-ray of 12/14/2017

CLINICAL DATA: Failure to thrive, loss of 50 pounds over the last
year, history of lung carcinoma

EXAM:
PORTABLE CHEST 1 VIEW

[chest ap]
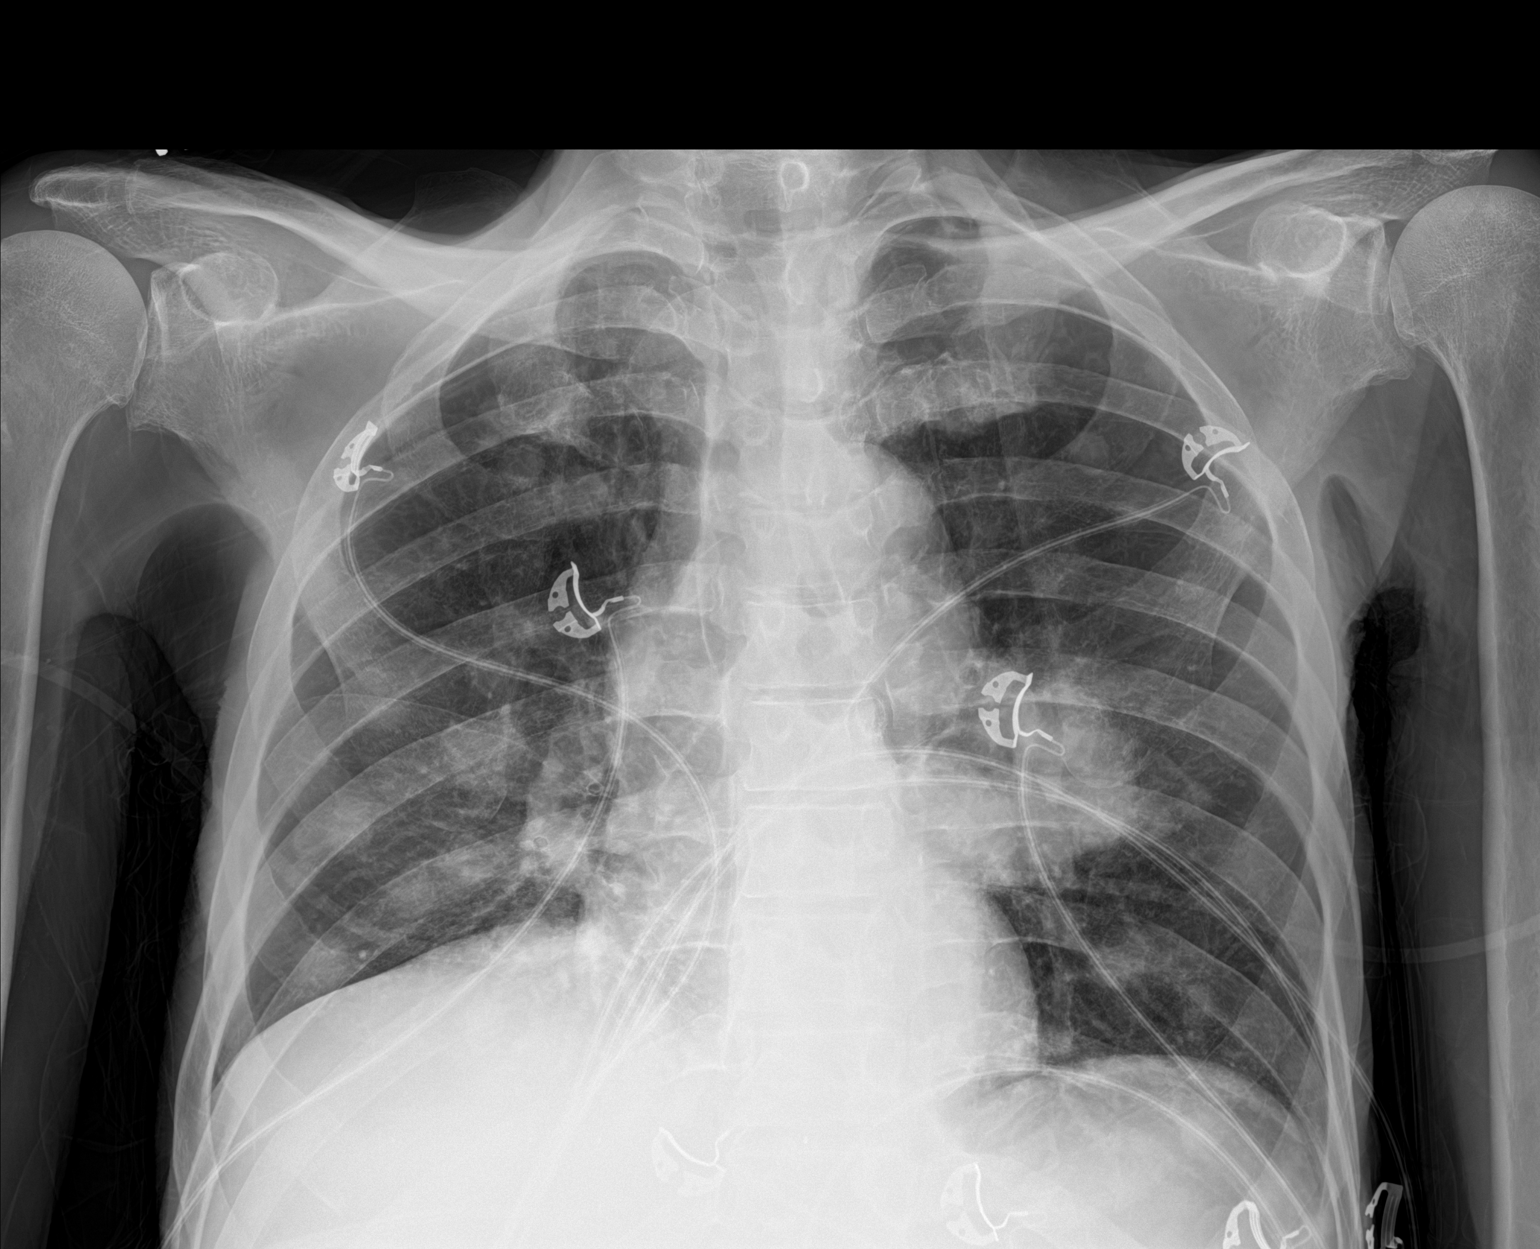

[1 of 1 positions shown; findings below may reference images not displayed]

FINDINGS: The left lower lobe mass appears to have increased in size
consistent with progression of lung carcinoma. Also there are
several nodular opacities in the right lower lobe most consistent
with metastases contra laterally with a probable 1 testis is at the
left lung base as well. No pleural effusion is seen. The heart is
within normal limits in size. The bones are unremarkable.
IMPRESSION: 1. Progression of known left lower lobe malignancy with apparent
bilateral lung metastases right more numerous than left by chest
x-ray.
2. No pneumonia or effusion is seen.
